# Patient Record
Sex: Female | Born: 2000 | Race: White | Hispanic: No | Marital: Married | Smoking: Former smoker
Health system: Southern US, Community
[De-identification: ages and names within clinical notes are randomized; demographics above are authoritative.]

---

## 2018-11-20 ENCOUNTER — Inpatient Hospital Stay: Admit: 2018-11-20 | Discharge: 2018-11-20 | Disposition: A | Discharge status: Home / Self Care | Payer: MEDICAID

## 2018-11-20 DIAGNOSIS — J069 Acute upper respiratory infection, unspecified: Secondary | ICD-10-CM

## 2018-11-20 LAB — RAPID INFLUENZA A/B ANTIGENS
Influenza A by PCR: NOT DETECTED
Influenza B by PCR: NOT DETECTED

## 2018-11-20 MED ORDER — BENZONATATE 100 MG PO CAPS
100 MG | ORAL_CAPSULE | Freq: Three times a day (TID) | ORAL | 0 refills | Status: AC | PRN
Start: 2018-11-20 — End: 2018-11-27

## 2018-11-20 MED ORDER — IBUPROFEN 800 MG PO TABS
800 MG | ORAL_TABLET | Freq: Three times a day (TID) | ORAL | 0 refills | Status: DC | PRN
Start: 2018-11-20 — End: 2019-09-26

## 2018-11-20 MED ORDER — GUAIFENESIN ER 600 MG PO TB12
600 MG | ORAL_TABLET | Freq: Two times a day (BID) | ORAL | 0 refills | Status: AC
Start: 2018-11-20 — End: 2018-12-05

## 2018-11-20 NOTE — ED Provider Notes (Signed)
Independent MLP      HPI:  11/20/18,   Time: 5:48 PM EDT         Tracy Steele is a 18 y.o. female presenting to the ED for cough, fever, sore throat, beginning 3 days ago.  The complaint has been persistent, moderate in severity, and worsened by nothing.  Patient states that she has been sick for several days.  She reports body aches, cough, sore throat and congestion.  The patient has had postnasal drip as well.  She denies chest pain or shortness of breath.  The patient states that several family members within the household have been ill as well.  She did not get the flu shot.  The patient states that no one has been tested.  No known exposure to [place] at 17.  No travel outside of the immediate area.  The patient does work at [employer].      ROS:     Constitutional: See HPI  HENT: See HPI  Eyes: Negative for pain, discharge and redness  Respiratory:  See HPI  Cardiovascular: Negative for CP, edema or palpitations  Gastrointestinal: Negative for nausea, vomiting, diarrhea and abdominal distention  Genitourinary: Negative for dysuria and frequency  Musculoskeletal: Negative for back pain and arthralgia  Skin: Negative for rash and wound  Neurological: Negative for weakness and headaches  Hematological: Negative for adenopathy    All other systems reviewed and are negative      -------------------------------- PAST HISTORY ----------------------------------  Past Medical History:  has no past medical history on file.    Past Surgical History:  has no past surgical history on file.    Social History:      Family History: family history is not on file.     The patient's home medications have been reviewed.    Allergies: Patient has no known allergies.    --------------------------------- RESULTS ------------------------------------------  All laboratory and radiology results have been personally reviewed by myself   LABS:  Results for orders placed or performed during the hospital encounter of 11/20/18   Rapid  influenza A/B antigens   Result Value Ref Range    Influenza A by PCR Not Detected Not Detected    Influenza B by PCR Not Detected Not Detected       RADIOLOGY:  Interpreted by Radiologist.  No orders to display       ----------------- NURSING NOTES AND VITALS REVIEWED ---------------   The nursing notes within the ED encounter and vital signs as below have been reviewed.   BP (!) 156/85   Pulse 86   Temp 97.3 F (36.3 C)   Resp 20   Ht 5\' 2"  (1.575 m)   Wt 215 lb (97.5 kg)   LMP 10/19/2018   SpO2 99%   BMI 39.32 kg/m   Oxygen Saturation Interpretation: Normal      --------------------------------PHYSICAL EXAM------------------------------------      Constitutional/General: Alert and oriented x3, ill appearing but nontoxic.    Head: NC/AT  Eyes: PERRL, EOMI  TM's intact and clear.  Posterior pharynx with mild erythema  Mouth: Oropharynx clear, handling secretions, no trismus  Neck: Supple, full ROM, no meningeal signs  Pulmonary: Lungs clear to auscultation bilaterally, no wheezes, rales, or rhonchi. Not in respiratory distress  Cardiovascular:  Regular rate and rhythm, no murmurs, gallops, or rubs. 2+ distal pulses  Extremities: Moves all extremities x 4. Warm and well perfused  Skin: warm and dry without rash  Neurologic: GCS 15,  Intact.  No  focal deficits  Psych: Normal Affect      ------------------------ ED COURSE/MEDICAL DECISION MAKING----------------------  Medications - No data to display      Medical Decision Making:    Influenza testing negative.   Viral illness.   Supportive care advised for now.   Final disposition pending at completion of shift.         Counseling:   The emergency provider has spoken with the patient and discussed today's results, in addition to providing specific details for the plan of care and counseling regarding the diagnosis and prognosis.  Questions are answered at this time and they are agreeable with the plan.      ------------------------ IMPRESSION AND DISPOSITION  -------------------------------    IMPRESSION  1. Acute upper respiratory infection        DISPOSITION  Disposition: Discharge to home  Patient condition is stable                   Eartha Inch, PA-C  11/21/18 [phone]

## 2019-05-11 ENCOUNTER — Encounter: Payer: Medicaid HMO | Attending: Facial Plastic Surgery

## 2019-07-18 ENCOUNTER — Emergency Department: Admit: 2019-07-19 | Payer: Medicaid HMO

## 2019-07-18 DIAGNOSIS — N390 Urinary tract infection, site not specified: Secondary | ICD-10-CM

## 2019-07-18 NOTE — ED Notes (Signed)
Pt remains in [place]     Nicola Girt, [place]  07/18/19 2219

## 2019-07-18 NOTE — ED Notes (Signed)
Bed: HD  Expected date:   Expected time:   Means of arrival:   Comments:  Triage [place]     Glean Salen, RN  07/18/19 2116

## 2019-07-18 NOTE — ED Provider Notes (Signed)
Patient presents with 3 days history of nausea, vomiting, diarrhea, and abdominal pain.  Patient states that the nausea and vomiting are intermittent.  She is able to tolerate food and liquids at times.  Her urination is normal.  Patient states that she has noticed some scant blood in her vomit at times but it is mostly stomach contents.  She also states that she will of some shortness of breath after episodes of vomiting.  She has not noticed any blood in her stool.  Her abdominal pain is generalized but worse in the epigastric region.  She rates it a 10 out of 10 pain.  She does endorse smoking approximately 3 to 4 cigarettes a day and drinks for special events.  She denies any illicit drug use.  She also states that she has not been taking any NSAIDs or anything for her pain.  She states that she is currently on her menses.  She is denying any lightheadedness, chest pain, cough, or sick contacts.    The history is provided by the patient. No language interpreter was used.   Nausea & Vomiting   Severity:  Moderate  Duration:  3 days  Timing:  Intermittent  Quality:  Stomach contents  Able to tolerate:  Liquids and solids  Progression:  Unchanged  Chronicity:  New  Recent urination:  Normal  Context: not post-tussive and not self-induced    Relieved by:  Nothing  Worsened by:  Nothing  Associated symptoms: abdominal pain and diarrhea    Associated symptoms: no arthralgias, no chills, no cough, no fever, no headaches and no sore throat         Review of Systems   Constitutional: Negative for chills and fever.   HENT: Negative for ear pain, sinus pressure and sore throat.    Eyes: Negative for pain, discharge and redness.   Respiratory: Negative for cough, shortness of breath and wheezing.    Cardiovascular: Negative for chest pain.   Gastrointestinal: Positive for abdominal pain, diarrhea, nausea and vomiting. Negative for abdominal distention and blood in stool.   Genitourinary: Negative for difficulty  urinating, dysuria, frequency, menstrual problem, pelvic pain and vaginal discharge.   Musculoskeletal: Negative for arthralgias and back pain.   Skin: Negative for rash and wound.   Neurological: Negative for weakness and headaches.   Hematological: Negative for adenopathy.   All other systems reviewed and are negative.       Physical Exam  Vitals signs and nursing note reviewed.   Constitutional:       General: She is not in acute distress.     Appearance: She is well-developed. She is obese. She is not ill-appearing or diaphoretic.   HENT:      Head: Normocephalic and atraumatic.   Eyes:      Conjunctiva/sclera: Conjunctivae normal.   Neck:      Musculoskeletal: Normal range of motion and neck supple.   Cardiovascular:      Rate and Rhythm: Normal rate and regular rhythm.      Heart sounds: Normal heart sounds. No murmur.   Pulmonary:      Effort: Pulmonary effort is normal. No respiratory distress.      Breath sounds: Normal breath sounds. No wheezing or rales.   Abdominal:      General: Abdomen is flat. Bowel sounds are normal. There is no distension.      Palpations: Abdomen is soft. There is no mass.      Tenderness: There is no abdominal  tenderness. There is no guarding or rebound. Negative signs include Murphy's sign.   Skin:     General: Skin is warm and dry.   Neurological:      Mental Status: She is alert and oriented to person, place, and time.      Cranial Nerves: No cranial nerve deficit.      Coordination: Coordination normal.          Procedures     MDM  Number of Diagnoses or Management Options  Diarrhea, unspecified type:   Nausea and vomiting, intractability of vomiting not specified, unspecified vomiting type:   Urinary tract infection without hematuria, site unspecified:   Diagnosis management comments: Patient presents with abdominal pain, nausea, and diarrhea.  Zofran given for nausea with improvement.  GI cocktail also given for abdominal pain with some improvement.  Right upper quadrant  ultrasound ordered which was unremarkable with no gallbladder pathology.  CBC did reveal an elevated white count at 14.0.  Patient's exam is unimpressive with no guarding or tenderness to the abdomen.  Patient's vitals have also been appropriate throughout her stay outside of some mild hypertension.  Patient's BMP was unremarkable.  Urinalysis did reveal many bacteria and 10-20 WBCs however there was also some epithelial cells.  Patient written a prescription for Keflex.  At this point there is not appear to be any emergent processes ongoing.  Abdominal pain and nausea likely secondary to UTI versus viral gastritis.  Patient is able to tolerate solids and liquids.  She is urinating appropriately.  Patient will be discharged with a prescription for Zofran for her nausea.  Patient educated on her prescriptions and when to return to the ED for reevaluation.  Patient expresses understanding.  Patient to be discharged.  Patient does not have a PCP and she will be given information for one to follow up with.       Amount and/or Complexity of Data Reviewed  Clinical lab tests: reviewed  Tests in the radiology section of CPT??: reviewed              --------------------------------------------- PAST HISTORY ---------------------------------------------  Past Medical History:  has no past medical history on file.    Past Surgical History:  has no past surgical history on file.    Social History:  reports that she has been smoking. She has never used smokeless tobacco. She reports current drug use. Drug: Marijuana.    Family History: family history is not on file.     The patient???s home medications have been reviewed.    Allergies: Patient has no known allergies.    -------------------------------------------------- RESULTS -------------------------------------------------  Labs:  Results for orders placed or performed during the hospital encounter of 07/18/19   CBC Auto Differential   Result Value Ref Range    WBC 14.0 (H)  4.5 - 11.5 E9/L    RBC 4.79 3.50 - 5.50 E12/L    Hemoglobin 13.4 11.5 - 15.5 g/dL    Hematocrit 42.1 34.0 - 48.0 %    MCV 87.9 80.0 - 99.9 fL    MCH 28.0 26.0 - 35.0 pg    MCHC 31.8 (L) 32.0 - 34.5 %    RDW 13.2 11.5 - 15.0 fL    Platelets 285 130 - 450 E9/L    MPV 11.6 7.0 - 12.0 fL    Neutrophils % 69.0 43.0 - 80.0 %    Immature Granulocytes % 0.4 0.0 - 5.0 %    Lymphocytes % 22.3 20.0 - 42.0 %  Monocytes % 6.4 2.0 - 12.0 %    Eosinophils % 1.4 0.0 - 6.0 %    Basophils % 0.5 0.0 - 2.0 %    Neutrophils Absolute 9.66 (H) 1.80 - 7.30 E9/L    Immature Granulocytes # 0.06 E9/L    Lymphocytes Absolute 3.13 1.50 - 4.00 E9/L    Monocytes Absolute 0.90 0.10 - 0.95 E9/L    Eosinophils Absolute 0.20 0.05 - 0.50 E9/L    Basophils Absolute 0.07 0.00 - 0.20 E9/L   Basic Metabolic Panel w/ Reflex to MG   Result Value Ref Range    Sodium 141 132 - 146 mmol/L    Potassium reflex Magnesium 4.5 3.5 - 5.0 mmol/L    Chloride 105 98 - 107 mmol/L    CO2 25 22 - 29 mmol/L    Anion Gap 11 7 - 16 mmol/L    Glucose 110 (H) 74 - 99 mg/dL    BUN 7 6 - 20 mg/dL    CREATININE 0.6 0.5 - 1.0 mg/dL    GFR Non-African American >60 >=60 mL/min/1.73    GFR African American >60     Calcium 9.5 8.6 - 10.2 mg/dL   Urinalysis, reflex to microscopic   Result Value Ref Range    Color, UA Yellow Straw/Yellow    Clarity, UA SL CLOUDY Clear    Glucose, Ur Negative Negative mg/dL    Bilirubin Urine Negative Negative    Ketones, Urine Negative Negative mg/dL    Specific Gravity, UA 1.015 1.005 - 1.030    Blood, Urine Negative Negative    pH, UA 8.0 5.0 - 9.0    Protein, UA Negative Negative mg/dL    Urobilinogen, Urine 0.2 <2.0 E.U./dL    Nitrite, Urine Negative Negative    Leukocyte Esterase, Urine MODERATE (A) Negative   COVID-19   Result Value Ref Range    SARS-CoV-2, NAAT Not Detected Not Detected   Basic Metabolic Panel w/ Reflex to MG   Result Value Ref Range    Sodium 142 132 - 146 mmol/L    Potassium reflex Magnesium 3.6 3.5 - 5.0 mmol/L    Chloride  103 98 - 107 mmol/L    CO2 26 22 - 29 mmol/L    Anion Gap 13 7 - 16 mmol/L    Glucose 107 (H) 74 - 99 mg/dL    BUN 7 6 - 20 mg/dL    CREATININE 0.7 0.5 - 1.0 mg/dL    GFR Non-African American >60 >=60 mL/min/1.73    GFR African American >60     Calcium 9.5 8.6 - 10.2 mg/dL   CBC WITH AUTO DIFFERENTIAL   Result Value Ref Range    WBC 13.9 (H) 4.5 - 11.5 E9/L    RBC 4.95 3.50 - 5.50 E12/L    Hemoglobin 13.6 11.5 - 15.5 g/dL    Hematocrit 40.9 81.1 - 48.0 %    MCV 86.5 80.0 - 99.9 fL    MCH 27.5 26.0 - 35.0 pg    MCHC 31.8 (L) 32.0 - 34.5 %    RDW 13.2 11.5 - 15.0 fL    Platelets 361 130 - 450 E9/L    MPV 10.7 7.0 - 12.0 fL    Neutrophils % 68.4 43.0 - 80.0 %    Immature Granulocytes % 0.2 0.0 - 5.0 %    Lymphocytes % 22.8 20.0 - 42.0 %    Monocytes % 6.5 2.0 - 12.0 %    Eosinophils % 1.7 0.0 -  6.0 %    Basophils % 0.4 0.0 - 2.0 %    Neutrophils Absolute 9.55 (H) 1.80 - 7.30 E9/L    Immature Granulocytes # 0.03 E9/L    Lymphocytes Absolute 3.17 1.50 - 4.00 E9/L    Monocytes Absolute 0.90 0.10 - 0.95 E9/L    Eosinophils Absolute 0.23 0.05 - 0.50 E9/L    Basophils Absolute 0.05 0.00 - 0.20 E9/L   Microscopic Urinalysis   Result Value Ref Range    WBC, UA 10-20 (A) 0 - 5 /HPF    RBC, UA 1-3 0 - 2 /HPF    Epithelial Cells, UA MANY /HPF    Bacteria, UA MANY (A) None Seen /HPF   POC Pregnancy Urine   Result Value Ref Range    HCG, Urine, POC Negative Negative    Lot Number [id number]     Positive QC Pass/Fail Pass     Negative QC Pass/Fail Pass        Radiology:  US GALLBLADDER RUQ   Final Result   Unremarkable right upper quadrant ultrasound.             ------------------------- NURSING NOTES AND VITALS REVIEWED ---------------------------  Date / Time Roomed:  07/18/2019  9:16 PM  ED Bed Assignment:  HALL B/HB    The nursing notes within the ED encounter and vital signs as below have been reviewed.   BP (!) 154/86    Pulse 82    Temp 96.6 ??F (35.9 ??C)    Resp 18    Ht 5\' 3"  (1.6 m)    Wt 212 lb (96.2 kg)    LMP 07/18/2019     SpO2 97%    BMI 37.55 kg/m??   Oxygen Saturation Interpretation: Normal      ------------------------------------------ PROGRESS NOTES ------------------------------------------  12:43 AM EST  I have spoken with the patient and discussed today???s results, in addition to providing specific details for the plan of care and counseling regarding the diagnosis and prognosis.  Their questions are answered at this time and they are agreeable with the plan. I discussed at length with them reasons for immediate return here for re evaluation. They will followup with their primary care physician by calling their office tomorrow.      --------------------------------- ADDITIONAL PROVIDER NOTES ---------------------------------  At this time the patient is without objective evidence of an acute process requiring hospitalization or inpatient management.  They have remained hemodynamically stable throughout their entire ED visit and are stable for discharge with outpatient follow-up.     The plan has been discussed in detail and they are aware of the specific conditions for emergent return, as well as the importance of follow-up.      New Prescriptions    CEPHALEXIN (KEFLEX) 500 MG CAPSULE    Take 1 capsule by mouth 2 times daily for 7 days    ONDANSETRON (ZOFRAN-ODT) 4 MG DISINTEGRATING TABLET    Take 1 tablet by mouth 3 times daily as needed for Nausea or Vomiting       Diagnosis:  1. Nausea and vomiting, intractability of vomiting not specified, unspecified vomiting type    2. Diarrhea, unspecified type    3. Urinary tract infection without hematuria, site unspecified        Disposition:  Patient's disposition: Discharge to home  Patient's condition is stable.    Patient was seen and evaluated by both myself and 13/03/2019, DO.  Cristobal GoldmannBrian M Tarshia Kot, DO  Resident  07/19/19 0046       Cristobal GoldmannBrian M Anila Bojarski, DO  Resident  07/19/19 [phone]

## 2019-07-19 ENCOUNTER — Inpatient Hospital Stay
Admit: 2019-07-19 | Discharge: 2019-07-19 | Disposition: A | Discharge status: Home / Self Care | Payer: PRIVATE HEALTH INSURANCE | Attending: Emergency Medicine

## 2019-07-19 LAB — POC PREGNANCY UR-QUAL: HCG, Urine, POC: NEGATIVE

## 2019-07-19 LAB — CBC WITH AUTO DIFFERENTIAL
Basophils %: 0.4 % (ref 0.0–2.0)
Basophils %: 0.5 % (ref 0.0–2.0)
Basophils Absolute: 0.05 E9/L (ref 0.00–0.20)
Basophils Absolute: 0.07 E9/L (ref 0.00–0.20)
Eosinophils %: 1.7 % (ref 0.0–6.0)
Eosinophils %: 1.4 % (ref 0.0–6.0)
Eosinophils Absolute: 0.23 E9/L (ref 0.05–0.50)
Eosinophils Absolute: 0.2 E9/L (ref 0.05–0.50)
Hematocrit: 42.8 % (ref 34.0–48.0)
Hematocrit: 42.1 % (ref 34.0–48.0)
Hemoglobin: 13.6 g/dL (ref 11.5–15.5)
Hemoglobin: 13.4 g/dL (ref 11.5–15.5)
Immature Granulocytes #: 0.03 E9/L
Immature Granulocytes #: 0.06 E9/L
Immature Granulocytes %: 0.2 % (ref 0.0–5.0)
Immature Granulocytes %: 0.4 % (ref 0.0–5.0)
Lymphocytes %: 22.8 % (ref 20.0–42.0)
Lymphocytes %: 22.3 % (ref 20.0–42.0)
Lymphocytes Absolute: 3.17 E9/L (ref 1.50–4.00)
Lymphocytes Absolute: 3.13 E9/L (ref 1.50–4.00)
MCH: 27.5 pg (ref 26.0–35.0)
MCH: 28 pg (ref 26.0–35.0)
MCHC: 31.8 % — ABNORMAL LOW (ref 32.0–34.5)
MCHC: 31.8 % — ABNORMAL LOW (ref 32.0–34.5)
MCV: 86.5 fL (ref 80.0–99.9)
MCV: 87.9 fL (ref 80.0–99.9)
MPV: 10.7 fL (ref 7.0–12.0)
MPV: 11.6 fL (ref 7.0–12.0)
Monocytes %: 6.5 % (ref 2.0–12.0)
Monocytes %: 6.4 % (ref 2.0–12.0)
Monocytes Absolute: 0.9 E9/L (ref 0.10–0.95)
Monocytes Absolute: 0.9 E9/L (ref 0.10–0.95)
Neutrophils %: 68.4 % (ref 43.0–80.0)
Neutrophils %: 69 % (ref 43.0–80.0)
Neutrophils Absolute: 9.55 E9/L — ABNORMAL HIGH (ref 1.80–7.30)
Neutrophils Absolute: 9.66 E9/L — ABNORMAL HIGH (ref 1.80–7.30)
Platelets: 361 E9/L (ref 130–450)
Platelets: 285 E9/L (ref 130–450)
RBC: 4.95 E12/L (ref 3.50–5.50)
RBC: 4.79 E12/L (ref 3.50–5.50)
RDW: 13.2 fL (ref 11.5–15.0)
RDW: 13.2 fL (ref 11.5–15.0)
WBC: 13.9 E9/L — ABNORMAL HIGH (ref 4.5–11.5)
WBC: 14 E9/L — ABNORMAL HIGH (ref 4.5–11.5)

## 2019-07-19 LAB — BASIC METABOLIC PANEL W/ REFLEX TO MG FOR LOW K
Anion Gap: 11 mmol/L (ref 7–16)
Anion Gap: 13 mmol/L (ref 7–16)
BUN: 7 mg/dL (ref 6–20)
BUN: 7 mg/dL (ref 6–20)
CO2: 25 mmol/L (ref 22–29)
CO2: 26 mmol/L (ref 22–29)
Calcium: 9.5 mg/dL (ref 8.6–10.2)
Calcium: 9.5 mg/dL (ref 8.6–10.2)
Chloride: 103 mmol/L (ref 98–107)
Chloride: 105 mmol/L (ref 98–107)
Creatinine: 0.6 mg/dL (ref 0.5–1.0)
Creatinine: 0.7 mg/dL (ref 0.5–1.0)
GFR African American: >60
GFR African American: >60
GFR Non-African American: >60 mL/min/{1.73_m2} (ref >=60)
GFR Non-African American: >60 mL/min/{1.73_m2} (ref >=60)
Glucose: 107 mg/dL — ABNORMAL HIGH (ref 74–99)
Glucose: 110 mg/dL — ABNORMAL HIGH (ref 74–99)
Potassium reflex Magnesium: 3.6 mmol/L (ref 3.5–5.0)
Potassium reflex Magnesium: 4.5 mmol/L (ref 3.5–5.0)
Sodium: 141 mmol/L (ref 132–146)
Sodium: 142 mmol/L (ref 132–146)

## 2019-07-19 LAB — MICROSCOPIC URINALYSIS

## 2019-07-19 LAB — URINALYSIS
Bilirubin Urine: NEGATIVE
Blood, Urine: NEGATIVE
Glucose, Ur: NEGATIVE mg/dL
Ketones, Urine: NEGATIVE mg/dL
Nitrite, Urine: NEGATIVE
Protein, UA: NEGATIVE mg/dL
Specific Gravity, UA: 1.015 (ref 1.005–1.030)
Urobilinogen, Urine: 0.2 E.U./dL (ref <2.0)
pH, UA: 8 (ref 5.0–9.0)

## 2019-07-19 LAB — COVID-19: SARS-CoV-2, NAAT: NOT DETECTED

## 2019-07-19 MED ORDER — SODIUM CHLORIDE 0.9 % IV BOLUS
0.9 % | Freq: Once | INTRAVENOUS | Status: AC
Start: 2019-07-19 — End: 2019-07-19
  Administered 2019-07-19: 04:00:00 1000 mL via INTRAVENOUS

## 2019-07-19 MED ORDER — ONDANSETRON HCL 4 MG/2ML IJ SOLN
4 MG/2ML | Freq: Once | INTRAMUSCULAR | Status: AC
Start: 2019-07-19 — End: 2019-07-18
  Administered 2019-07-19: 04:00:00 4 mg via INTRAVENOUS

## 2019-07-19 MED ORDER — LIDOCAINE VISCOUS HCL 2 % MT SOLN
2 % | Freq: Once | OROMUCOSAL | Status: AC
Start: 2019-07-19 — End: 2019-07-18
  Administered 2019-07-19: 04:00:00 via ORAL

## 2019-07-19 MED ORDER — CEPHALEXIN 500 MG PO CAPS
500 MG | ORAL_CAPSULE | Freq: Two times a day (BID) | ORAL | 0 refills | Status: AC
Start: 2019-07-19 — End: 2019-07-26

## 2019-07-19 MED ORDER — ONDANSETRON 4 MG PO TBDP
4 MG | ORAL_TABLET | Freq: Three times a day (TID) | ORAL | 0 refills | Status: DC | PRN
Start: 2019-07-19 — End: 2020-07-28

## 2019-07-19 MED FILL — ONDANSETRON HCL 4 MG/2ML IJ SOLN: 4 MG/2ML | INTRAMUSCULAR | Qty: 2

## 2019-07-19 MED FILL — MAG-AL PLUS 200-200-20 MG/5ML PO LIQD: 200-200-20 MG/5ML | ORAL | Qty: 30

## 2019-07-19 NOTE — Discharge Instructions (Signed)
Take the Zofran as needed for nausea.  Take the antibiotics as prescribed for your UTI.  Follow-up with your PCP for further evaluation and treatment.

## 2019-07-20 LAB — CULTURE, URINE: Urine Culture, Routine: 10

## 2019-09-25 DIAGNOSIS — N920 Excessive and frequent menstruation with regular cycle: Secondary | ICD-10-CM

## 2019-09-25 NOTE — ED Provider Notes (Signed)
Independent MLP         [facility]  Department of Emergency Medicine   ED  Encounter Note  Admit Date/RoomTime: 09/25/2019 11:58 PM  ED Room: 01/01  NAME: Tracy Steele  DOB: January 06, 2001  MRN: [id number]     Chief Complaint:  Vaginal Bleeding (started tonight)    HISTORY OF PRESENT ILLNESS        Tracy Steele is a 19 y.o. female who presents to the ED by ambulance for passing a blood clot while in the shower and she is concerned about having a miscarriage though she does not know if she is pregnant, beginning an hour ago. The complaint has been rapidly improving and are mild in severity.  She reports she had some spotting a couple days ago was pink but it stopped then she started bleeding today.  When the clot passed she was bleeding profusely while she got out of the shower. IT seems to have abated at this time. PT has the blood clot on a tissue. IT is about the size of a penny.    ROS   Pertinent positives and negatives are stated within HPI, all other systems reviewed and are negative.    Past Medical History:  has no past medical history on file.    Surgical History:  has no past surgical history on file.    Social History:  reports that she has been smoking. She has never used smokeless tobacco. She reports previous alcohol use. She reports current drug use. Drug: Marijuana.    Family History: family history is not on file.     Allergies: Patient has no known allergies.    PHYSICAL EXAM   Oxygen Saturation Interpretation: Normal.        ED Triage Vitals [09/25/19 2355]   BP Temp Temp src Pulse Resp SpO2 Height Weight   (!) 148/88 97.8 F (36.6 C) -- 96 18 98 % 5\' 2"  (1.575 m) 203 lb (92.1 kg)         Physical Exam  Constitutional/General: Alert and oriented x3, well appearing, non toxic  HEENT:  NC/NT. PERRLA,  Airway patent.  Neck: Supple, full ROM, non tender to palpation in the midline, no stridor, no crepitus, no meningeal signs  Respiratory: Lungs clear to auscultation  bilaterally, no wheezes, rales, or rhonchi. Not in respiratory distress  CV:  Regular rate. Regular rhythm. No murmurs, gallops, or rubs. 2+ distal pulses  GI:  Abdomen Soft, Non tender, Non distended.  +BS.   No rebound, guarding, or rigidity. No pulsatile masses.  Musculoskeletal: Moves all extremities x 4. Warm and well perfused, no clubbing, cyanosis, or edema. Capillary refill <3 seconds  Integument: skin warm and dry. No rashes.   Lymphatic: no lymphadenopathy noted  Neurologic: GCS 15, no focal deficits, symmetric strength 5/5 in the upper and lower extremities bilaterally  Psychiatric: Normal Affect    Lab / Imaging Results   (All laboratory and radiology results have been personally reviewed by myself)  Labs:  Results for orders placed or performed during the hospital encounter of 09/25/19   Urinalysis   Result Value Ref Range    Color, UA Yellow Straw/Yellow    Clarity, UA Clear Clear    Glucose, Ur Negative Negative mg/dL    Bilirubin Urine Negative Negative    Ketones, Urine Negative Negative mg/dL    Specific Gravity, UA >=1.030 1.005 - 1.030    Blood, Urine MODERATE (A) Negative    pH, UA 6.0  5.0 - 9.0    Protein, UA Negative Negative mg/dL    Urobilinogen, Urine 0.2 <2.0 E.U./dL    Nitrite, Urine Negative Negative    Leukocyte Esterase, Urine Negative Negative   Microscopic Urinalysis   Result Value Ref Range    WBC, UA 1-3 0 - 5 /HPF    RBC, UA 1-3 0 - 2 /HPF    Bacteria, UA RARE (A) None Seen /HPF   POC Pregnancy Urine Qual   Result Value Ref Range    HCG, Urine, POC Negative Negative    Lot Number [id number]     Positive QC Pass/Fail Pass     Negative QC Pass/Fail Pass      Imaging:  All Radiology results interpreted by Radiologist unless otherwise noted.  No orders to display       ED Course / Medical Decision Making   Medications - No data to display     Re-examination:  09/26/19       Time: PT is comfortable, no continued bleeding in ED.    Consult(s):   None    Procedure(s):   none    MDM:   Pt  presents to ED with a blood clot that passed while in shower, which she brought with her, it is very small. She had a show of more blood but none since being in ED. According to Pt report this is the regular time of her monthly period but she is trying to get pregnant and is concerned. Normal    Plan of Care/Counseling:  I reviewed today's visit with the patient in addition to providing specific details for the plan of care and counseling regarding the diagnosis and prognosis.  Questions are answered at this time and are agreeable with the plan.    ASSESSMENT     1. Menorrhagia with irregular cycle    2. Urinary tract infection without hematuria, site unspecified      PLAN   Discharged home.  Patient condition is stable    New Medications     Discharge Medication List as of 09/26/2019  1:11 AM      START taking these medications    Details   cephALEXin (KEFLEX) 500 MG capsule Take 1 capsule by mouth 4 times daily for 5 days, Disp-20 capsule, R-0Normal           Electronically signed by Shelly Flatten, PA-C   DD: 09/26/19  **This report was transcribed using voice recognition software. Every effort was made to ensure accuracy; however, inadvertent computerized transcription errors may be present.  END OF ED PROVIDER NOTE       Shelly Flatten, PA-C  09/26/19 0219       Shelly Flatten, PA-C  09/26/19 0220  ATTENDING PROVIDER ATTESTATION:     Supervising Physician, on-site, available for consultation, non-participatory in the evaluation or care of this patient       Dwain Sarna, MD  09/26/19 [phone]

## 2019-09-26 ENCOUNTER — Inpatient Hospital Stay
Admit: 2019-09-26 | Discharge: 2019-09-26 | Disposition: A | Discharge status: Home / Self Care | Payer: PRIVATE HEALTH INSURANCE

## 2019-09-26 LAB — POC PREGNANCY UR-QUAL: HCG, Urine, POC: NEGATIVE

## 2019-09-26 LAB — MICROSCOPIC URINALYSIS

## 2019-09-26 LAB — URINALYSIS
Bilirubin Urine: NEGATIVE
Glucose, Ur: NEGATIVE mg/dL
Ketones, Urine: NEGATIVE mg/dL
Leukocyte Esterase, Urine: NEGATIVE
Nitrite, Urine: NEGATIVE
Protein, UA: NEGATIVE mg/dL
Specific Gravity, UA: >=1.03 (ref 1.005–1.030)
Urobilinogen, Urine: 0.2 E.U./dL (ref <2.0)
pH, UA: 6 (ref 5.0–9.0)

## 2019-09-26 MED ORDER — CEPHALEXIN 500 MG PO CAPS
500 MG | ORAL_CAPSULE | Freq: Four times a day (QID) | ORAL | 0 refills | Status: AC
Start: 2019-09-26 — End: 2019-10-01

## 2019-09-26 MED ORDER — IBUPROFEN 800 MG PO TABS
800 MG | ORAL_TABLET | Freq: Four times a day (QID) | ORAL | 0 refills | Status: DC | PRN
Start: 2019-09-26 — End: 2020-07-28

## 2020-07-25 ENCOUNTER — Encounter: Payer: Medicaid HMO | Attending: Family Medicine

## 2020-07-25 NOTE — Progress Notes (Deleted)
[facility]  Department of Family Medicine      Patient:  Tracy Steele 19 y.o. female     Date of Service: 07/25/20      Chief complaint: No chief complaint on file.        History ofPresent Illness   The patient is a 19 y.o. female  presented to the clinic with complaints as above.    NTP  -    Past Medical History:  No past medical history on file.    PastSurgical History:    No past surgical history on file.    Allergies:    Patient has no known allergies.    Social History:   Social History     Socioeconomic History   ??? Marital status: Single     Spouse name: Not on file   ??? Number of children: Not on file   ??? Years of education: Not on file   ??? Highest education level: Not on file   Occupational History   ??? Not on file   Tobacco Use   ??? Smoking status: Current Every Day Smoker   ??? Smokeless tobacco: Never Used   Vaping Use   ??? Vaping Use: Some days   Substance and Sexual Activity   ??? Alcohol use: Not Currently   ??? Drug use: Yes     Types: Marijuana Sheran Fava)   ??? Sexual activity: Yes     Partners: Male   Other Topics Concern   ??? Not on file   Social History Narrative   ??? Not on file     Social Determinants of Health     Financial Resource Strain:    ??? Difficulty of Paying Living Expenses: Not on file   Food Insecurity:    ??? Worried About Running Out of Food in the Last Year: Not on file   ??? Ran Out of Food in the Last Year: Not on file   Transportation Needs:    ??? Lack of Transportation (Medical): Not on file   ??? Lack of Transportation (Non-Medical): Not on file   Physical Activity:    ??? Days of Exercise per Week: Not on file   ??? Minutes of Exercise per Session: Not on file   Stress:    ??? Feeling of Stress : Not on file   Social Connections:    ??? Frequency of Communication with Friends and Family: Not on file   ??? Frequency of Social Gatherings with Friends and Family: Not on file   ??? Attends Religious Services: Not on file   ??? Active Member of Clubs or Organizations: Not on file   ???  Attends Banker Meetings: Not on file   ??? Marital Status: Not on file   Intimate Partner Violence:    ??? Fear of Current or Ex-Partner: Not on file   ??? Emotionally Abused: Not on file   ??? Physically Abused: Not on file   ??? Sexually Abused: Not on file   Housing Stability:    ??? Unable to Pay for Housing in the Last Year: Not on file   ??? Number of Places Lived in the Last Year: Not on file   ??? Unstable Housing in the Last Year: Not on file        Family History:   No family history on file.    Review of Systems:   Review of Systems    Physical Exam   Vitals: There were no vitals  taken for this visit.  Physical Exam        Assessment and Plan       There are no diagnoses linked to this encounter.    Counseled regarding above diagnosis, including possible risks and complications,  especially if left uncontrolled. Counseled regarding the possible side effects, risks, benefits and alternatives to treatment;patient and/or guardian verbalizes understanding, agrees, feels comfortable with and wishes to proceed with above treatment plan.    Call or go to EDimmediately if symptoms worsen or persist. Advised patient to call with any new medication issues, and, as applicable, read all Rx info from pharmacy to assure aware of all possible risks and side effects of medicationbefore taking.     Patient and/or guardian given opportunity to ask questions/raise concerns.  The patient verbalized comfort and understanding ofinstructions.    I encourage further reading and education about your health conditions.  Information on many health conditions is provided by theAmerican Academy of Family Physicians: https://familydoctor.org/  Please bring any questions to me at your nextvisit.    Return to Office: No follow-ups on file.    Medication List:    Current Outpatient Medications   Medication Sig Dispense Refill   ??? ibuprofen (ADVIL;MOTRIN) 800 MG tablet Take 1 tablet by mouth every 6 hours as needed for Pain 20 tablet 0   ???  ondansetron (ZOFRAN-ODT) 4 MG disintegrating tablet Take 1 tablet by mouth 3 times daily as needed for Nausea or Vomiting 21 tablet 0     No current facility-administered medications for this visit.        Niel Hummer, DO       This document may have been prepared at least partially through the use of voice recognition software. Although effort is taken to assure the accuracy ofthis document, it is possible that grammatical, syntax,  or spelling errors may occur.

## 2020-07-28 ENCOUNTER — Emergency Department: Admit: 2020-07-29 | Payer: Medicaid HMO

## 2020-07-28 DIAGNOSIS — S20211A Contusion of right front wall of thorax, initial encounter: Secondary | ICD-10-CM

## 2020-07-28 LAB — POC PREGNANCY UR-QUAL: HCG, Urine, POC: NEGATIVE

## 2020-07-28 NOTE — ED Provider Notes (Signed)
[facility]   HPI:  07/28/20, Time: 8:23 PM EST         Tracy Steele is a 19 y.o. female presenting to the ED for 1 week history of right lateral rib pain.  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  She states that she got struck by a metal bar on the [place] lift striking her right side of her ribs.  Patient reports that since then she has had increased pain.  She denies any specific chest pain or shortness of breath as well as no noted back or flank pain.  She also denies any fevers with this as well as no noted lower extremity swelling.  Patient also denies any birth control use as well as no recent travel.  Patient states that she took a Tylenol yesterday but essentially has not taken anything for this pain.  Symptoms mild in severity and persistent.    Review of Systems:   A complete review of systems was performed and pertinent positives and negatives are stated within HPI, all other systems reviewed and are negative.          --------------------------------------------- PAST HISTORY ---------------------------------------------  Past Medical History:  has no past medical history on file.    Past Surgical History:  has no past surgical history on file.    Social History:  reports that she has been smoking. She has never used smokeless tobacco. She reports previous alcohol use. She reports current drug use. Drug: Marijuana Sheran Fava).    Family History: family history is not on file.     The patient???s home medications have been reviewed.    Allergies: Patient has no known allergies.    -------------------------------------------------- RESULTS -------------------------------------------------  All laboratory and radiology results have been personally reviewed by myself   LABS:  Results for orders placed or performed during the hospital encounter of 07/28/20   POC Pregnancy Urine   Result Value Ref  Range    HCG, Urine, POC Negative Negative    Lot Number [id number]     Positive QC Pass/Fail Pass     Negative QC Pass/Fail Pass        RADIOLOGY:  Interpreted by Radiologist.  XR RIBS RIGHT INCLUDE CHEST (MIN 3 VIEWS)   Final Result   No acute abnormality of the ribs.      No acute process in the lungs.             ------------------------- NURSING NOTES AND VITALS REVIEWED ---------------------------   The nursing notes within the ED encounter and vital signs as below have been reviewed.   BP (!) 143/91    Pulse 107    Temp 98.2 ??F (36.8 ??C) (Oral)    Resp 15    Ht 5\' 2"  (1.575 m)    Wt 212 lb (96.2 kg)    LMP 07/20/2020    SpO2 97%    BMI 38.78 kg/m??   Oxygen Saturation Interpretation: Normal      ---------------------------------------------------PHYSICAL EXAM--------------------------------------      Constitutional/General: Alert and oriented x3, well appearing, non toxic in NAD  Head: Normocephalic and atraumatic  Eyes: PERRL, EOMI  Mouth: Oropharynx clear, handling secretions, no trismus  Neck: Supple, full ROM,   Pulmonary: Lungs clear to auscultation bilaterally, no wheezes, rales, or rhonchi. Not in respiratory distress, patient with reproducible pain across the right lateral anterior chest/intercostal spaces primarily  at 7, #8, #9.  Cardiovascular:  Regular rate and rhythm, no murmurs, gallops, or rubs. 2+ distal pulses  Abdomen: Soft, non tender, non distended,   Extremities: Moves all extremities x 4. Warm and well perfused  Skin: warm and dry without rash  Neurologic: GCS 15,  Psych: Normal Affect      ------------------------------ ED COURSE/MEDICAL DECISION MAKING----------------------  Medications   ketorolac (TORADOL) injection 30 mg (30 mg IntraMUSCular Given 07/28/20 2056)         ED COURSE:       Medical Decision Making: Plan will be for imaging will medicate with Toradol 30 mg IM injection.  Will rule out fracture.  X-ray of the right ribs with a chest is completely negative.  Patient was made  aware of results.,  Patient is not at all tachycardic, heart rate 89 with pulse ox 98%.  Patient was educated on cough and deep breathing and utilizing a pillow or blanket for splinting.  She was made aware that she likely has a rib contusion and the importance of utilizing Naprosyn to assist with pain and well as decreasing swelling.  Patient overall nontoxic.  Patient made aware for follow-up care as well as when to return back to the emergency department.  Patient expressed understanding and will be safely discharged home       Counseling:   The emergency provider has spoken with the patient and discussed today???s results, in addition to providing specific details for the plan of care and counseling regarding the diagnosis and prognosis.  Questions are answered at this time and they are agreeable with the plan.      --------------------------------- IMPRESSION AND DISPOSITION ---------------------------------    IMPRESSION  1. Contusion of rib on right side, initial encounter    2. Rib pain on right side        DISPOSITION  Disposition: Discharge to home  Patient condition is good      NOTE: This report was transcribed using voice recognition software. Every effort was made to ensure accuracy; however, inadvertent computerized transcription errors may be present     Ellery Plunk, APRN - CNP  07/29/20 0353    ATTENDING PROVIDER ATTESTATION:     Supervising Physician, on-site, available for consultation, non-participatory in the evaluation or care of this patient         Dwain Sarna, MD  07/29/20 [phone]

## 2020-07-29 ENCOUNTER — Inpatient Hospital Stay: Admit: 2020-07-29 | Discharge: 2020-07-29 | Disposition: A | Discharge status: Home / Self Care | Payer: Medicaid HMO

## 2020-07-29 MED ORDER — KETOROLAC TROMETHAMINE 30 MG/ML IJ SOLN
30 MG/ML | Freq: Once | INTRAMUSCULAR | Status: AC
Start: 2020-07-29 — End: 2020-07-28
  Administered 2020-07-29: 02:00:00 30 mg via INTRAMUSCULAR

## 2020-07-29 MED ORDER — NAPROXEN 500 MG PO TABS
500 MG | ORAL_TABLET | Freq: Two times a day (BID) | ORAL | 0 refills | Status: DC | PRN
Start: 2020-07-29 — End: 2020-07-28

## 2020-07-29 MED ORDER — NAPROXEN 500 MG PO TABS
500 MG | ORAL_TABLET | Freq: Two times a day (BID) | ORAL | 0 refills | Status: DC | PRN
Start: 2020-07-29 — End: 2020-11-08

## 2020-07-29 MED FILL — KETOROLAC TROMETHAMINE 30 MG/ML IJ SOLN: 30 mg/mL | INTRAMUSCULAR | Qty: 1

## 2020-11-08 ENCOUNTER — Emergency Department: Admit: 2020-11-08 | Payer: Auto Insurance (includes no fault)

## 2020-11-08 ENCOUNTER — Inpatient Hospital Stay
Admit: 2020-11-08 | Discharge: 2020-11-08 | Disposition: A | Discharge status: Home / Self Care | Payer: MEDICAID | Attending: Emergency Medicine

## 2020-11-08 DIAGNOSIS — S0081XA Abrasion of other part of head, initial encounter: Secondary | ICD-10-CM

## 2020-11-08 MED ORDER — FENTANYL CITRATE (PF) 100 MCG/2ML IJ SOLN
100 MCG/2ML | Freq: Once | INTRAMUSCULAR | Status: DC
Start: 2020-11-08 — End: 2020-11-08

## 2020-11-08 MED ORDER — TETANUS-DIPHTH-ACELL PERTUSSIS 5-2.5-18.5 LF-MCG/0.5 IM SUSY
Freq: Once | INTRAMUSCULAR | Status: DC
Start: 2020-11-08 — End: 2020-11-08

## 2020-11-08 MED ORDER — OXYCODONE-ACETAMINOPHEN 5-325 MG PO TABS
5-325 MG | Freq: Once | ORAL | Status: AC
Start: 2020-11-08 — End: 2020-11-08
  Administered 2020-11-08: 16:00:00 via ORAL

## 2020-11-08 MED ORDER — ONDANSETRON HCL 4 MG/2ML IJ SOLN
4 MG/2ML | Freq: Once | INTRAMUSCULAR | Status: DC
Start: 2020-11-08 — End: 2020-11-08

## 2020-11-08 MED ORDER — NAPROXEN 500 MG PO TABS
500 MG | ORAL_TABLET | Freq: Two times a day (BID) | ORAL | 0 refills | Status: AC | PRN
Start: 2020-11-08 — End: 2020-11-18

## 2020-11-08 MED FILL — FENTANYL CITRATE (PF) 100 MCG/2ML IJ SOLN: 100 MCG/2ML | INTRAMUSCULAR | Qty: 2

## 2020-11-08 MED FILL — OXYCODONE-ACETAMINOPHEN 5-325 MG PO TABS: 5-325 mg | ORAL | Qty: 1

## 2020-11-08 MED FILL — ONDANSETRON HCL 4 MG/2ML IJ SOLN: 4 MG/2ML | INTRAMUSCULAR | Qty: 2

## 2020-11-08 MED FILL — BOOSTRIX 5-2.5-18.5 LF-MCG/0.5 IM SUSY: 5-2.5-18.5 LF-MCG/0.5 | INTRAMUSCULAR | Qty: 0.5
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
## 2020-11-09 ENCOUNTER — Inpatient Hospital Stay: Admit: 2020-11-09 | Discharge: 2020-11-09 | Disposition: A | Discharge status: Home / Self Care | Payer: MEDICAID

## 2020-11-09 DIAGNOSIS — S161XXA Strain of muscle, fascia and tendon at neck level, initial encounter: Secondary | ICD-10-CM

## 2020-11-09 MED ORDER — METHOCARBAMOL 500 MG PO TABS
500 MG | ORAL_TABLET | Freq: Four times a day (QID) | ORAL | 0 refills | Status: DC | PRN
Start: 2020-11-09 — End: 2020-11-10

## 2020-11-09 MED ORDER — LIDOCAINE 5 % EX PTCH
5 % | MEDICATED_PATCH | Freq: Every day | CUTANEOUS | 0 refills | Status: AC
Start: 2020-11-09 — End: 2020-11-19

## 2020-11-09 MED ORDER — ORPHENADRINE CITRATE 30 MG/ML IJ SOLN
30 MG/ML | Freq: Once | INTRAMUSCULAR | Status: AC
Start: 2020-11-09 — End: 2020-11-09
  Administered 2020-11-09: 14:00:00 via INTRAMUSCULAR

## 2020-11-09 MED FILL — ORPHENADRINE CITRATE 30 MG/ML IJ SOLN: 30 mg/mL | INTRAMUSCULAR | Qty: 2
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
## 2020-11-09 NOTE — Discharge Instructions (Signed)
Please return to the ED with new or worsening symptoms. Follow up with PCP for recheck.

## 2020-11-10 ENCOUNTER — Emergency Department: Admit: 2020-11-10 | Payer: MEDICAID

## 2020-11-10 ENCOUNTER — Inpatient Hospital Stay
Admit: 2020-11-10 | Discharge: 2020-11-10 | Disposition: A | Discharge status: Home / Self Care | Payer: MEDICAID | Attending: Emergency Medicine

## 2020-11-10 DIAGNOSIS — S161XXA Strain of muscle, fascia and tendon at neck level, initial encounter: Secondary | ICD-10-CM

## 2020-11-10 LAB — POC PREGNANCY UR-QUAL: HCG, Urine, POC: NEGATIVE

## 2020-11-10 MED ORDER — DIAZEPAM 5 MG PO TABS
5 MG | Freq: Once | ORAL | Status: AC
Start: 2020-11-10 — End: 2020-11-10
  Administered 2020-11-10: 18:00:00 via ORAL

## 2020-11-10 MED ORDER — DIAZEPAM 5 MG PO TABS
5 MG | ORAL_TABLET | Freq: Three times a day (TID) | ORAL | 0 refills | Status: DC | PRN
Start: 2020-11-10 — End: 2020-11-10

## 2020-11-10 MED ORDER — KETOROLAC TROMETHAMINE 30 MG/ML IJ SOLN
30 MG/ML | Freq: Once | INTRAMUSCULAR | Status: AC
Start: 2020-11-10 — End: 2020-11-10
  Administered 2020-11-10: 20:00:00 via INTRAMUSCULAR

## 2020-11-10 MED FILL — DIAZEPAM 5 MG PO TABS: 5 mg | ORAL | Qty: 1

## 2020-11-10 MED FILL — KETOROLAC TROMETHAMINE 30 MG/ML IJ SOLN: 30 mg/mL | INTRAMUSCULAR | Qty: 1
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
## 2020-11-10 NOTE — ED Notes (Signed)
Patient off floor to CT     Mauricia Area, RN  11/10/20 1226

## 2020-11-10 NOTE — ED Notes (Signed)
Patient came to the emergency department with complaints of pain in head and neck patient states she was light headed dizzy and vomiting last night from prescribed muscle relaxer. Patient currently connected to monitor able to make needs known.     Mauricia Area, RN  11/10/20 1342

## 2021-07-21 ENCOUNTER — Emergency Department (HOSPITAL_COMMUNITY)
Admission: EM | Admit: 2021-07-21 | Discharge: 2021-07-21 | Disposition: A | Discharge status: Home / Self Care | Payer: Medicaid - Out of State | Attending: Emergency Medicine | Admitting: Emergency Medicine

## 2021-07-21 ENCOUNTER — Emergency Department (HOSPITAL_COMMUNITY): Payer: Medicaid - Out of State

## 2021-07-21 ENCOUNTER — Other Ambulatory Visit: Payer: Self-pay

## 2021-07-21 ENCOUNTER — Encounter (HOSPITAL_COMMUNITY): Payer: Self-pay

## 2021-07-21 DIAGNOSIS — J069 Acute upper respiratory infection, unspecified: Secondary | ICD-10-CM | POA: Insufficient documentation

## 2021-07-21 DIAGNOSIS — Z20822 Contact with and (suspected) exposure to covid-19: Secondary | ICD-10-CM | POA: Insufficient documentation

## 2021-07-21 DIAGNOSIS — R059 Cough, unspecified: Secondary | ICD-10-CM | POA: Diagnosis present

## 2021-07-21 LAB — RESP PANEL BY RT-PCR (FLU A&B, COVID) ARPGX2
Influenza A by PCR: NEGATIVE
Influenza B by PCR: NEGATIVE
SARS Coronavirus 2 by RT PCR: NEGATIVE

## 2021-07-21 MED ORDER — ALBUTEROL SULFATE HFA 108 (90 BASE) MCG/ACT IN AERS
4.0000 | INHALATION_SPRAY | Freq: Once | RESPIRATORY_TRACT | Status: AC
  Administered 2021-07-21: 4 via RESPIRATORY_TRACT
  Filled 2021-07-21: qty 6.7

## 2021-07-21 MED ORDER — PREDNISONE 50 MG PO TABS: ORAL_TABLET | ORAL | 0 refills | Status: DC

## 2021-07-21 NOTE — ED Triage Notes (Signed)
Pt presents with cough and congestion x 3 days.  ?

## 2021-07-21 NOTE — Discharge Instructions (Addendum)
Use inhaler 2 puffs every 4 hours

## 2021-07-21 NOTE — ED Provider Notes (Signed)
[facility]-EMERGENCY DEPT Provider Note   CSN: [id number] Arrival date & time: 07/21/21  1153     History Chief Complaint  Patient presents with   Cough    Julia Benson is a 20 y.o. female.  The history is provided by the patient. No language interpreter was used.  Cough Cough characteristics:  Non-productive Sputum characteristics:  Nondescript Severity:  Moderate Onset quality:  Gradual Duration:  3 days Timing:  Constant Progression:  Worsening Chronicity:  New Smoker: no   Relieved by:  Nothing Worsened by:  Nothing Ineffective treatments:  None tried Associated symptoms: fever and shortness of breath   Risk factors: no recent infection       History reviewed. No pertinent past medical history.  There are no problems to display for this patient.   History reviewed. No pertinent surgical history.   OB History   No obstetric history on file.     History reviewed. No pertinent family history.     Home Medications Prior to Admission medications   Not on File    Allergies    Patient has no known allergies.  Review of Systems   Review of Systems  Constitutional:  Positive for fever.  Respiratory:  Positive for cough and shortness of breath.   All other systems reviewed and are negative.  Physical Exam Updated Vital Signs BP (!) 109/96 (BP Location: Left Arm)   Pulse 87   Temp 99.1 F (37.3 C) (Oral)   Resp 16   SpO2 95%   Physical Exam Vitals and nursing note reviewed.  Constitutional:      Appearance: She is well-developed.  HENT:     Head: Normocephalic.     Right Ear: Tympanic membrane normal.     Left Ear: Tympanic membrane normal.     Mouth/Throat:     Mouth: Mucous membranes are moist.  Cardiovascular:     Rate and Rhythm: Normal rate.  Pulmonary:     Effort: Pulmonary effort is normal.     Breath sounds: Wheezing present.  Abdominal:     General: Abdomen is flat. There is no distension.   Musculoskeletal:        General: Normal range of motion.     Cervical back: Normal range of motion.  Neurological:     Mental Status: She is alert and oriented to person, place, and time.    ED Results / Procedures / Treatments   Labs (all labs ordered are listed, but only abnormal results are displayed) Labs Reviewed  RESP PANEL BY RT-PCR (FLU A&B, COVID) ARPGX2    EKG None  Radiology DG Chest Portable 1 View  Result Date: 07/21/2021 CLINICAL DATA:  Cough for 1 week EXAM: PORTABLE CHEST 1 VIEW COMPARISON:  None. FINDINGS: The heart size and mediastinal contours are within normal limits. No focal airspace consolidation, pleural effusion, or pneumothorax. The visualized skeletal structures are unremarkable. IMPRESSION: No active disease. Electronically Signed   By: Duanne Guess D.O.   On: 07/21/2021 12:43    Procedures Procedures   Medications Ordered in ED Medications  albuterol (VENTOLIN HFA) 108 (90 Base) MCG/ACT inhaler 4 puff (4 puffs Inhalation Given 07/21/21 1250)    ED Course  I have reviewed the triage vital signs and the nursing notes.  Pertinent labs & imaging results that were available during my care of the patient were reviewed by me and considered in my medical decision making (see chart for details).    MDM Rules/Calculators/A&P  MDM:  covid and influenza are negative.  Pt given albuterol  Prednisone x 5 days.   Final Clinical Impression(s) / ED Diagnoses Final diagnoses:  Viral URI with cough    Rx / DC Orders ED Discharge Orders          Ordered    predniSONE (DELTASONE) 50 MG tablet        07/21/21 1401          An After Visit Summary was printed and given to the patient.    Elson Areas, [place] 07/21/21 1402    Terrilee Files, MD 07/21/21 [phone]

## 2021-08-26 ENCOUNTER — Other Ambulatory Visit: Payer: Self-pay

## 2021-08-26 ENCOUNTER — Emergency Department (HOSPITAL_COMMUNITY)
Admission: EM | Admit: 2021-08-26 | Discharge: 2021-08-27 | Disposition: A | Discharge status: Home / Self Care | Payer: Medicaid Other | Attending: Emergency Medicine | Admitting: Emergency Medicine

## 2021-08-26 ENCOUNTER — Encounter (HOSPITAL_COMMUNITY): Payer: Self-pay

## 2021-08-26 DIAGNOSIS — F17210 Nicotine dependence, cigarettes, uncomplicated: Secondary | ICD-10-CM | POA: Insufficient documentation

## 2021-08-26 DIAGNOSIS — R112 Nausea with vomiting, unspecified: Secondary | ICD-10-CM | POA: Insufficient documentation

## 2021-08-26 DIAGNOSIS — R197 Diarrhea, unspecified: Secondary | ICD-10-CM | POA: Insufficient documentation

## 2021-08-26 DIAGNOSIS — N898 Other specified noninflammatory disorders of vagina: Secondary | ICD-10-CM | POA: Insufficient documentation

## 2021-08-26 DIAGNOSIS — R109 Unspecified abdominal pain: Secondary | ICD-10-CM | POA: Insufficient documentation

## 2021-08-26 DIAGNOSIS — Z79899 Other long term (current) drug therapy: Secondary | ICD-10-CM | POA: Insufficient documentation

## 2021-08-26 DIAGNOSIS — Z20822 Contact with and (suspected) exposure to covid-19: Secondary | ICD-10-CM | POA: Insufficient documentation

## 2021-08-26 NOTE — ED Triage Notes (Signed)
Patient arrives from home with complaint of generalized abdominal pain and N/V x 2 days. Pt also reporting dental pain x 3 days.

## 2021-08-26 NOTE — ED Provider Notes (Signed)
Emergency Medicine Provider Triage Evaluation Note  Zionna Homewood , a 20 y.o. female  was evaluated in triage.  Pt complains of nausea, vomiting, diarrhea x2 days associated with generalized abdominal pain.  Admits to mild increased vaginal discharge.  No urinary symptoms.  No previous abdominal operations.  Admits to cough and chills.  Patient also endorses left upper dental pain.   Review of Systems  Positive: N/V/D/abdominal pain Negative: dysuria  Physical Exam  BP (!) 126/53 (BP Location: Right Arm)    Pulse 78    Temp 97.6 F (36.4 C) (Oral)    Resp 19    Ht 5\' 2"  (1.575 m)    Wt 83.5 kg    LMP 08/26/2021 (Exact Date)    SpO2 100%    BMI 33.65 kg/m  Gen:   Awake, no distress   Resp:  Normal effort  MSK:   Moves extremities without difficulty  Other:    Medical Decision Making  Medically screening exam initiated at 11:30 PM.  Appropriate orders placed.  Giovanni Bath was informed that the remainder of the evaluation will be completed by another provider, this initial triage assessment does not replace that evaluation, and the importance of remaining in the ED until their evaluation is complete.  Abdominal labs Covid/influenza test   Alisa Graff 08/26/21 2332    08/28/21, MD 08/27/21 [phone]

## 2021-08-27 LAB — CBC WITH DIFFERENTIAL/PLATELET
Abs Immature Granulocytes: 0.04 10*3/uL (ref 0.00–0.07)
Basophils Absolute: 0.1 10*3/uL (ref 0.0–0.1)
Basophils Relative: 1 %
Eosinophils Absolute: 0.6 10*3/uL — ABNORMAL HIGH (ref 0.0–0.5)
Eosinophils Relative: 5 %
HCT: 40.8 % (ref 36.0–46.0)
Hemoglobin: 12.8 g/dL (ref 12.0–15.0)
Immature Granulocytes: 0 %
Lymphocytes Relative: 26 %
Lymphs Abs: 3.3 10*3/uL (ref 0.7–4.0)
MCH: 28.6 pg (ref 26.0–34.0)
MCHC: 31.4 g/dL (ref 30.0–36.0)
MCV: 91.1 fL (ref 80.0–100.0)
Monocytes Absolute: 1 10*3/uL (ref 0.1–1.0)
Monocytes Relative: 8 %
Neutro Abs: 7.5 10*3/uL (ref 1.7–7.7)
Neutrophils Relative %: 60 %
Platelets: 342 10*3/uL (ref 150–400)
RBC: 4.48 MIL/uL (ref 3.87–5.11)
RDW: 13.9 % (ref 11.5–15.5)
WBC: 12.5 10*3/uL — ABNORMAL HIGH (ref 4.0–10.5)
nRBC: 0 % (ref 0.0–0.2)

## 2021-08-27 LAB — COMPREHENSIVE METABOLIC PANEL
ALT: 10 U/L (ref 0–44)
AST: 16 U/L (ref 15–41)
Albumin: 3.8 g/dL (ref 3.5–5.0)
Alkaline Phosphatase: 48 U/L (ref 38–126)
Anion gap: 7 (ref 5–15)
BUN: 17 mg/dL (ref 6–20)
CO2: 22 mmol/L (ref 22–32)
Calcium: 8.5 mg/dL — ABNORMAL LOW (ref 8.9–10.3)
Chloride: 112 mmol/L — ABNORMAL HIGH (ref 98–111)
Creatinine, Ser: 0.75 mg/dL (ref 0.44–1.00)
GFR, Estimated: >60 mL/min (ref >60)
Glucose, Bld: 86 mg/dL (ref 70–99)
Potassium: 3.6 mmol/L (ref 3.5–5.1)
Sodium: 141 mmol/L (ref 135–145)
Total Bilirubin: 0.5 mg/dL (ref 0.3–1.2)
Total Protein: 6.7 g/dL (ref 6.5–8.1)

## 2021-08-27 LAB — URINALYSIS, ROUTINE W REFLEX MICROSCOPIC
Bilirubin Urine: NEGATIVE
Glucose, UA: NEGATIVE mg/dL
Ketones, ur: NEGATIVE mg/dL
Leukocytes,Ua: NEGATIVE
Nitrite: NEGATIVE
Protein, ur: NEGATIVE mg/dL
RBC / HPF: >50 RBC/hpf — ABNORMAL HIGH (ref 0–5)
Specific Gravity, Urine: 1.023 (ref 1.005–1.030)
pH: 5 (ref 5.0–8.0)

## 2021-08-27 LAB — WET PREP, GENITAL
Clue Cells Wet Prep HPF POC: NONE SEEN
Sperm: NONE SEEN
Trich, Wet Prep: NONE SEEN
WBC, Wet Prep HPF POC: <10 (ref <10)
Yeast Wet Prep HPF POC: NONE SEEN

## 2021-08-27 LAB — I-STAT BETA HCG BLOOD, ED (MC, WL, AP ONLY): I-stat hCG, quantitative: <5 m[IU]/mL (ref <5)

## 2021-08-27 LAB — RESP PANEL BY RT-PCR (FLU A&B, COVID) ARPGX2
Influenza A by PCR: NEGATIVE
Influenza B by PCR: NEGATIVE
SARS Coronavirus 2 by RT PCR: NEGATIVE

## 2021-08-27 LAB — LIPASE, BLOOD: Lipase: 38 U/L (ref 11–51)

## 2021-08-27 MED ORDER — LOPERAMIDE HCL 2 MG PO CAPS: 2.0000 mg | ORAL_CAPSULE | Freq: Every day | ORAL | 0 refills | Status: DC | PRN

## 2021-08-27 MED ORDER — ONDANSETRON HCL 4 MG PO TABS: 4.0000 mg | ORAL_TABLET | Freq: Three times a day (TID) | ORAL | 0 refills | Status: DC | PRN

## 2021-08-27 NOTE — ED Provider Notes (Signed)
[facility]-EMERGENCY DEPT Provider Note   CSN: [id number] Arrival date & time: 08/26/21  2252     History Chief Complaint  Patient presents with   Abdominal Pain    Julia Benson is a 20 y.o. female who presents with 2 days fo n/v/d. She c/o pain in the left abd, radiating to her flank. She denies urinary sxs. She has had some epigastric firmness and thought she might be pregnant. She denies fever, urinary sxs. She has had some abnormal vaginal discharge.   Abdominal Pain     History reviewed. No pertinent past medical history.  There are no problems to display for this patient.   History reviewed. No pertinent surgical history.   OB History   No obstetric history on file.     History reviewed. No pertinent family history.  Social History   Tobacco Use   Smoking status: Every Day    Types: Cigarettes   Smokeless tobacco: Never  Vaping Use   Vaping Use: Never used  Substance Use Topics   Alcohol use: Not Currently   Drug use: Never    Home Medications Prior to Admission medications   Medication Sig Start Date End Date Taking? Authorizing Provider  predniSONE (DELTASONE) 50 MG tablet One tablet a day 07/21/21   Elson Areas, PA-C    Allergies    Patient has no known allergies.  Review of Systems   Review of Systems  Gastrointestinal:  Positive for abdominal pain.   Physical Exam Updated Vital Signs BP 125/83 (BP Location: Left Arm)    Pulse 70    Temp 98.2 F (36.8 C) (Oral)    Resp 16    Ht 5\' 2"  (1.575 m)    Wt 83.5 kg    LMP 08/26/2021 (Exact Date)    SpO2 100%    BMI 33.65 kg/m   Physical Exam Vitals and nursing note reviewed. Exam conducted with a chaperone present 08/28/2021).  Constitutional:      General: She is not in acute distress.    Appearance: She is well-developed. She is not diaphoretic.  HENT:     Head: Normocephalic and atraumatic.     Right Ear: External ear normal.     Left Ear: External ear  normal.     Nose: Nose normal.     Mouth/Throat:     Mouth: Mucous membranes are moist.  Eyes:     General: No scleral icterus.    Conjunctiva/sclera: Conjunctivae normal.  Cardiovascular:     Rate and Rhythm: Normal rate and regular rhythm.     Heart sounds: Normal heart sounds. No murmur heard.   No friction rub. No gallop.  Pulmonary:     Effort: Pulmonary effort is normal. No respiratory distress.     Breath sounds: Normal breath sounds.  Abdominal:     General: Bowel sounds are normal. There is no distension.     Palpations: Abdomen is soft. There is no mass.     Tenderness: There is abdominal tenderness in the suprapubic area. There is no right CVA tenderness, left CVA tenderness or guarding.  Genitourinary:    Vagina: Normal.     Cervix: Cervical bleeding present.     Uterus: Normal.      Adnexa: Right adnexa normal and left adnexa normal.  Musculoskeletal:     Cervical back: Normal range of motion.  Skin:    General: Skin is warm and dry.  Neurological:     Mental Status: She  is alert and oriented to person, place, and time.  Psychiatric:        Behavior: Behavior normal.    ED Results / Procedures / Treatments   Labs (all labs ordered are listed, but only abnormal results are displayed) Labs Reviewed  CBC WITH DIFFERENTIAL/PLATELET - Abnormal; Notable for the following components:      Result Value   WBC 12.5 (*)    Eosinophils Absolute 0.6 (*)    All other components within normal limits  COMPREHENSIVE METABOLIC PANEL - Abnormal; Notable for the following components:   Chloride 112 (*)    Calcium 8.5 (*)    All other components within normal limits  URINALYSIS, ROUTINE W REFLEX MICROSCOPIC - Abnormal; Notable for the following components:   APPearance HAZY (*)    Hgb urine dipstick LARGE (*)    RBC / HPF >50 (*)    Bacteria, UA RARE (*)    All other components within normal limits  RESP PANEL BY RT-PCR (FLU A&B, COVID) ARPGX2  LIPASE, BLOOD  I-STAT BETA  HCG BLOOD, ED ([facility], [facility], [facility] ONLY)    EKG None  Radiology No results found.  Procedures Procedures   Medications Ordered in ED Medications - No data to display  ED Course  I have reviewed the triage vital signs and the nursing notes.  Pertinent labs & imaging results that were available during my care of the patient were reviewed by me and considered in my medical decision making (see chart for details).    MDM Rules/Calculators/A&P                         Patient here with n/v/d. Patient began her menstrual cycle while waiting overnight. The differential diagnosis of diarrhea includes but is not limited to Viral- norovirus/rotavirus; Bacterial-Campylobacter,Shigella, Salmonella, Escherichia coli, E. coli 0157:H7, Yersinia enterocolitica, Vibrio cholerae, Clostridium difficile. Parasitic- Giardia lamblia, Cryptosporidium,Entamoeba histolytica,Cyclospora, Microsporidium. Toxin- Staphylococcus aureus, Bacillus cereus. Noninfectious causes include GI Bleed, Appendicitis, Mesenteric Ischemia, Diverticulitis, Adrenal Crisis, Thyroid Storm, Toxicologic exposures, Antibiotic or drug-associated, inflammatory bowel disease. I ordered and reviewed labs that include CBC with elevated white blood cell count, 12.5.  CMP without significant abnormality urine appears contaminated.  Patient is currently menstruating which accounts for her large amount of blood in the urine.  Respiratory panel is negative for COVID or influenza. Pelvic exam is benign.  Patient has had no active vomiting or diarrhea here.  She is a negative pregnancy test.  Patient may have had of viral infection.  She is a benign abdominal exam.  She appears appropriate for discharge at this time  Final Clinical Impression(s) / ED Diagnoses Final diagnoses:  Nausea vomiting and diarrhea    Rx / DC Orders ED Discharge Orders     None        Arthor Captain, PA-C 08/27/21 1655    Rolan Bucco, MD 08/30/21 2302

## 2021-08-27 NOTE — Discharge Instructions (Signed)
Get help right away if you: Have chest pain. Feel extremely weak or you faint. See blood in your vomit. Have vomit that looks like coffee grounds. Have bloody or black stools or stools that look like tar. Have a severe headache, a stiff neck, or both. Have a rash. Have severe pain, cramping, or bloating in your abdomen. Have trouble breathing or you are breathing very quickly. Have a fast heartbeat. Have skin that feels cold and clammy. Feel confused. Have pain when you urinate. Have signs of dehydration, such as: Dark urine, very little urine, or no urine. Cracked lips. Dry mouth. Sunken eyes. Sleepiness. Weakness. 

## 2021-08-28 LAB — GC/CHLAMYDIA PROBE AMP (~~LOC~~) NOT AT ARMC
Chlamydia: NEGATIVE
Comment: NEGATIVE
Comment: NORMAL
Neisseria Gonorrhea: NEGATIVE

## 2022-02-03 ENCOUNTER — Encounter (HOSPITAL_COMMUNITY): Payer: Self-pay | Admitting: *Deleted

## 2022-02-03 ENCOUNTER — Ambulatory Visit (HOSPITAL_COMMUNITY)
Admission: EM | Admit: 2022-02-03 | Discharge: 2022-02-03 | Disposition: A | Discharge status: Home / Self Care | Payer: Medicaid Other | Attending: Internal Medicine | Admitting: Internal Medicine

## 2022-02-03 ENCOUNTER — Other Ambulatory Visit: Payer: Self-pay

## 2022-02-03 DIAGNOSIS — J302 Other seasonal allergic rhinitis: Secondary | ICD-10-CM

## 2022-02-03 DIAGNOSIS — R0981 Nasal congestion: Secondary | ICD-10-CM

## 2022-02-03 DIAGNOSIS — Z3202 Encounter for pregnancy test, result negative: Secondary | ICD-10-CM

## 2022-02-03 LAB — POCT URINALYSIS DIPSTICK, ED / UC
Bilirubin Urine: NEGATIVE
Glucose, UA: NEGATIVE mg/dL
Hgb urine dipstick: NEGATIVE
Ketones, ur: NEGATIVE mg/dL
Leukocytes,Ua: NEGATIVE
Nitrite: NEGATIVE
Protein, ur: NEGATIVE mg/dL
Specific Gravity, Urine: 1.025 (ref 1.005–1.030)
Urobilinogen, UA: 1 mg/dL (ref 0.0–1.0)
pH: 6.5 (ref 5.0–8.0)

## 2022-02-03 LAB — POC URINE PREG, ED: Preg Test, Ur: NEGATIVE

## 2022-02-03 MED ORDER — CETIRIZINE HCL 10 MG PO TABS: 10.0000 mg | ORAL_TABLET | Freq: Every day | ORAL | 2 refills | Status: DC

## 2022-02-03 MED ORDER — OLOPATADINE HCL 0.1 % OP SOLN: 1.0000 [drp] | Freq: Two times a day (BID) | OPHTHALMIC | 12 refills | Status: AC

## 2022-02-03 MED ORDER — FLUTICASONE PROPIONATE 50 MCG/ACT NA SUSP: 1.0000 | Freq: Every day | NASAL | 2 refills | Status: DC

## 2022-02-03 NOTE — Discharge Instructions (Addendum)
You were seen in urgent care today for your seasonal allergies.  Take cetirizine once daily.  Spray 1 spray of Flonase nasal spray in each nostril once daily.  Use olopatadine eyedrops 1 drop into both eyes twice daily as needed.  Follow-up with urgent care as needed.

## 2022-02-03 NOTE — ED Triage Notes (Signed)
Pt reports having a cough and congestion. Pt reports she has a sinus infection. Pt reports Sx's started 3 days ago. Pt also reports nausea.

## 2022-02-03 NOTE — ED Provider Notes (Signed)
[facility]    CSN: [id number] Arrival date & time: 02/03/22  1422      History   Chief Complaint Chief Complaint  Patient presents with   Nasal Congestion   Cough    HPI Jonetta Dagley is a 21 y.o. female.   Patient presents urgent care for evaluation of nasal congestion, watery/itchy eyes, ear itching, throat itching and cough that started 3 days ago.  She states that this is her first allergy season in [place] and attributes symptoms to seasonal allergies.  Denies fever/chills, nausea, vomiting, urinary symptoms, dizziness, headache, and ear pain.  Denies history of asthma.  Denies sore throat, difficulty swallowing, and eye pain.  She has not taken any medications for her symptoms over-the-counter at home.  No other aggravating or relieving factors identified at this time.   Cough  History reviewed. No pertinent past medical history.  There are no problems to display for this patient.   History reviewed. No pertinent surgical history.  OB History   No obstetric history on file.      Home Medications    Prior to Admission medications   Medication Sig Start Date End Date Taking? Authorizing Provider  loperamide (IMODIUM) 2 MG capsule Take 1-2 capsules (2-4 mg total) by mouth daily as needed for diarrhea or loose stools. 08/27/21   Arthor Captain, PA-C  ondansetron (ZOFRAN) 4 MG tablet Take 1 tablet (4 mg total) by mouth every 8 (eight) hours as needed for nausea or vomiting. 08/27/21   Arthor Captain, PA-C  predniSONE (DELTASONE) 50 MG tablet One tablet a day 07/21/21   Elson Areas, PA-C    Family History History reviewed. No pertinent family history.  Social History Social History   Tobacco Use   Smoking status: Every Day    Types: Cigarettes   Smokeless tobacco: Never  Vaping Use   Vaping Use: Never used  Substance Use Topics   Alcohol use: Not Currently   Drug use: Never     Allergies   Patient has no known  allergies.   Review of Systems Review of Systems  Respiratory:  Positive for cough.   Per HPI  Physical Exam Triage Vital Signs ED Triage Vitals  Enc Vitals Group     BP 02/03/22 1521 110/76     Pulse Rate 02/03/22 1521 75     Resp 02/03/22 1521 18     Temp 02/03/22 1521 98.3 F (36.8 C)     Temp src --      SpO2 02/03/22 1521 99 %     Weight --      Height --      Head Circumference --      Peak Flow --      Pain Score 02/03/22 1518 0     Pain Loc --      Pain Edu? --      Excl. in GC? --    No data found.  Updated Vital Signs BP 110/76   Pulse 75   Temp 98.3 F (36.8 C)   Resp 18   LMP  (LMP Unknown)   SpO2 99%   Visual Acuity Right Eye Distance:   Left Eye Distance:   Bilateral Distance:    Right Eye Near:   Left Eye Near:    Bilateral Near:     Physical Exam Vitals and nursing note reviewed.  Constitutional:      General: She is not in acute distress.    Appearance: Normal  appearance. She is well-developed. She is not ill-appearing.  HENT:     Head: Normocephalic and atraumatic.     Right Ear: Tympanic membrane, ear canal and external ear normal.     Left Ear: Tympanic membrane, ear canal and external ear normal.     Nose: Rhinorrhea present. No congestion.     Mouth/Throat:     Mouth: Mucous membranes are moist.     Pharynx: Posterior oropharyngeal erythema present.     Comments: Mild erythema to posterior oropharynx with small amount of clear postnasal drainage visualized. Airway intact and patent. Eyes:     General:        Right eye: No discharge.        Left eye: No discharge.     Extraocular Movements: Extraocular movements intact.     Conjunctiva/sclera: Conjunctivae normal.  Cardiovascular:     Rate and Rhythm: Normal rate and regular rhythm.     Heart sounds: Normal heart sounds. No murmur heard.   No friction rub. No gallop.  Pulmonary:     Effort: Pulmonary effort is normal. No respiratory distress.     Breath sounds: Normal  breath sounds. No wheezing, rhonchi or rales.  Chest:     Chest wall: No tenderness.  Abdominal:     Palpations: Abdomen is soft.  Musculoskeletal:        General: No swelling.     Cervical back: Neck supple.  Lymphadenopathy:     Cervical: No cervical adenopathy.  Skin:    General: Skin is warm and dry.     Capillary Refill: Capillary refill takes less than 2 seconds.     Findings: No rash.  Neurological:     General: No focal deficit present.     Mental Status: She is alert and oriented to person, place, and time.  Psychiatric:        Mood and Affect: Mood normal.        Behavior: Behavior normal.        Thought Content: Thought content normal.        Judgment: Judgment normal.     UC Treatments / Results  Labs (all labs ordered are listed, but only abnormal results are displayed) Labs Reviewed  POCT URINALYSIS DIPSTICK, ED / UC  POC URINE PREG, ED    EKG   Radiology No results found.  Procedures Procedures (including critical care time)  Medications Ordered in UC Medications - No data to display  Initial Impression / Assessment and Plan / UC Course  I have reviewed the triage vital signs and the nursing notes.  Pertinent labs & imaging results that were available during my care of the patient were reviewed by me and considered in my medical decision making (see chart for details).  Patient is a 21 year old female presenting to urgent care today for evaluation of allergy symptoms.  Doubt viral or bacterial etiology of patient's symptoms at this time.  Deferred imaging based on stable cardiopulmonary exam.  Patient remains afebrile in clinic at and at home.  Plan to treat allergic rhinitis with cetirizine 10 mg to be taken once daily and Flonase 1 spray in each nostril daily.  Olopatadine eyedrops prescribed to be used 1 drop into each eye twice daily as needed.  Patient complained of nausea to nursing staff.  Urinalysis is negative for urinary tract infection.   Urine pregnancy test is negative as well.  Counseled patient regarding appropriate use of medications and potential side effects for all medications  recommended or prescribed today. Discussed red flag signs and symptoms of worsening condition,when to call the PCP office, return to urgent care, and when to seek higher level of care. Patient verbalizes understanding and agreement with plan. All questions answered. Patient discharged in stable condition.  Final Clinical Impressions(s) / UC Diagnoses   Final diagnoses:  Seasonal allergies  Nasal congestion     Discharge Instructions      You were seen in urgent care today for your seasonal allergies.  Take cetirizine once daily.  Spray 1 spray of Flonase nasal spray in each nostril once daily.  Use olopatadine eyedrops 1 drop into both eyes twice daily as needed.  Follow-up with urgent care as needed.     ED Prescriptions   None    PDMP not reviewed this encounter.   Carlisle BeersStanhope, Kayal Mula M, [place]FNP 02/03/22 [phone]

## 2022-02-07 ENCOUNTER — Ambulatory Visit
Admission: EM | Admit: 2022-02-07 | Discharge: 2022-02-07 | Disposition: A | Discharge status: Home / Self Care | Payer: Medicaid Other

## 2022-02-07 DIAGNOSIS — J069 Acute upper respiratory infection, unspecified: Secondary | ICD-10-CM

## 2022-02-07 NOTE — ED Provider Notes (Signed)
[facility]    CSN: [id number] Arrival date & time: 02/07/22  1311      History   Chief Complaint Chief Complaint  Patient presents with  . Cough  . Headache    HPI Julia Benson is a 21 y.o. female.   Pleasant 21 year old female who moved here from [place] last year presents today with concerns of continued URI symptoms.  She was seen on 5/27 due to itchy ears, itchy eyes, scratchy throat.  She was started on Flonase and oloptadine eye drops.  She was also prescribed cetirizine, but states it was not covered by her insurance thus did not pick it up.  She endorses many of the symptoms are still present, but not any worse than previous.  Her main symptom today is an intermittent headache.  She states it started yesterday and is primarily around her left eye.  She states her eye will intermittently twitch.  She states she had a headache in the waiting room, but it is since resolved.  She states it comes and goes without any treatment.  She does have a history of migraines but feels this is different.  She usually takes Tylenol PM for her migraines.  She denies any fever.  She has an intermittent dry cough without shortness of breath or chest pain.   Cough Associated symptoms: headaches   Headache Associated symptoms: cough    History reviewed. No pertinent past medical history.  There are no problems to display for this patient.   History reviewed. No pertinent surgical history.  OB History   No obstetric history on file.      Home Medications    Prior to Admission medications   Medication Sig Start Date End Date Taking? Authorizing Provider  fluticasone (FLONASE) 50 MCG/ACT nasal spray Place 1 spray into both nostrils daily. 02/03/22   Carlisle Beers, FNP  olopatadine (PATANOL) 0.1 % ophthalmic solution Place 1 drop into both eyes 2 (two) times daily. 02/03/22   Carlisle Beers, FNP    Family History History reviewed. No pertinent family  history.  Social History Social History   Tobacco Use  . Smoking status: Every Day    Types: Cigarettes  . Smokeless tobacco: Never  Vaping Use  . Vaping Use: Never used  Substance Use Topics  . Alcohol use: Not Currently  . Drug use: Never     Allergies   Patient has no known allergies.   Review of Systems Review of Systems  Respiratory:  Positive for cough.   Neurological:  Positive for headaches.    Physical Exam Triage Vital Signs ED Triage Vitals [02/07/22 1323]  Enc Vitals Group     BP 124/84     Pulse Rate 67     Resp 18     Temp 97.9 F (36.6 C)     Temp Source Oral     SpO2 98 %     Weight      Height      Head Circumference      Peak Flow      Pain Score      Pain Loc      Pain Edu?      Excl. in GC?    No data found.  Updated Vital Signs BP 124/84 (BP Location: Left Arm)   Pulse 67   Temp 97.9 F (36.6 C) (Oral)   Resp 18   LMP  (LMP Unknown)   SpO2 98%   Visual Acuity Right  Eye Distance:   Left Eye Distance:   Bilateral Distance:    Right Eye Near:   Left Eye Near:    Bilateral Near:     Physical Exam   UC Treatments / Results  Labs (all labs ordered are listed, but only abnormal results are displayed) Labs Reviewed - No data to display  EKG   Radiology No results found.  Procedures Procedures (including critical care time)  Medications Ordered in UC Medications - No data to display  Initial Impression / Assessment and Plan / UC Course  I have reviewed the triage vital signs and the nursing notes.  Pertinent labs & imaging results that were available during my care of the patient were reviewed by me and considered in my medical decision making (see chart for details).     *** Final Clinical Impressions(s) / UC Diagnoses   Final diagnoses:  Viral upper respiratory tract infection     Discharge Instructions      Go to [facility] and purchase Chlorpheniramine 4mg  tab every 6-8 hours. This will help with  your stuffy nose. Continue your nasal spray and eye drops prescribed on the 27th. If your headache comes back, please try ibuprofen (also known as motrin or advil) 600mg  (which is 3 tabs) with food. You can take this every 6-8 hours. Saline nasal spray would be helpful.    ED Prescriptions   None    PDMP not reviewed this encounter.

## 2022-02-07 NOTE — Discharge Instructions (Addendum)
Go to [facility] and purchase Chlorpheniramine 4mg  tab every 6-8 hours. This will help with your stuffy nose. Continue your nasal spray and eye drops prescribed on the 27th. If your headache comes back, please try ibuprofen (also known as motrin or advil) 600mg  (which is 3 tabs) with food. You can take this every 6-8 hours. Saline nasal spray would be helpful.

## 2022-02-07 NOTE — ED Triage Notes (Signed)
Pt present cough with congestion, symptoms started three days ago. Pt was recently seen at the other location for same symptoms.

## 2022-07-07 ENCOUNTER — Emergency Department (HOSPITAL_COMMUNITY): Payer: Self-pay

## 2022-07-07 ENCOUNTER — Emergency Department (HOSPITAL_COMMUNITY)
Admission: EM | Admit: 2022-07-07 | Discharge: 2022-07-08 | Discharge status: Against Medical Advice | Payer: Self-pay | Attending: Emergency Medicine | Admitting: Emergency Medicine

## 2022-07-07 ENCOUNTER — Other Ambulatory Visit: Payer: Self-pay

## 2022-07-07 ENCOUNTER — Encounter (HOSPITAL_COMMUNITY): Payer: Self-pay

## 2022-07-07 DIAGNOSIS — R102 Pelvic and perineal pain: Secondary | ICD-10-CM | POA: Insufficient documentation

## 2022-07-07 DIAGNOSIS — R079 Chest pain, unspecified: Secondary | ICD-10-CM | POA: Insufficient documentation

## 2022-07-07 DIAGNOSIS — Z5321 Procedure and treatment not carried out due to patient leaving prior to being seen by health care provider: Secondary | ICD-10-CM | POA: Insufficient documentation

## 2022-07-07 DIAGNOSIS — R1031 Right lower quadrant pain: Secondary | ICD-10-CM | POA: Insufficient documentation

## 2022-07-07 DIAGNOSIS — R519 Headache, unspecified: Secondary | ICD-10-CM | POA: Insufficient documentation

## 2022-07-07 DIAGNOSIS — R220 Localized swelling, mass and lump, head: Secondary | ICD-10-CM | POA: Insufficient documentation

## 2022-07-07 DIAGNOSIS — R55 Syncope and collapse: Secondary | ICD-10-CM | POA: Insufficient documentation

## 2022-07-07 LAB — COMPREHENSIVE METABOLIC PANEL
ALT: 12 U/L (ref 0–44)
AST: 16 U/L (ref 15–41)
Albumin: 4.4 g/dL (ref 3.5–5.0)
Alkaline Phosphatase: 50 U/L (ref 38–126)
Anion gap: 8 (ref 5–15)
BUN: 14 mg/dL (ref 6–20)
CO2: 24 mmol/L (ref 22–32)
Calcium: 8.8 mg/dL — ABNORMAL LOW (ref 8.9–10.3)
Chloride: 106 mmol/L (ref 98–111)
Creatinine, Ser: 0.85 mg/dL (ref 0.44–1.00)
GFR, Estimated: >60 mL/min (ref >60)
Glucose, Bld: 90 mg/dL (ref 70–99)
Potassium: 3.5 mmol/L (ref 3.5–5.1)
Sodium: 138 mmol/L (ref 135–145)
Total Bilirubin: 0.8 mg/dL (ref 0.3–1.2)
Total Protein: 7.5 g/dL (ref 6.5–8.1)

## 2022-07-07 LAB — CBC WITH DIFFERENTIAL/PLATELET
Abs Immature Granulocytes: 0.02 10*3/uL (ref 0.00–0.07)
Basophils Absolute: 0.1 10*3/uL (ref 0.0–0.1)
Basophils Relative: 1 %
Eosinophils Absolute: 0.3 10*3/uL (ref 0.0–0.5)
Eosinophils Relative: 3 %
HCT: 40.6 % (ref 36.0–46.0)
Hemoglobin: 13.2 g/dL (ref 12.0–15.0)
Immature Granulocytes: 0 %
Lymphocytes Relative: 21 %
Lymphs Abs: 2.6 10*3/uL (ref 0.7–4.0)
MCH: 29.9 pg (ref 26.0–34.0)
MCHC: 32.5 g/dL (ref 30.0–36.0)
MCV: 91.9 fL (ref 80.0–100.0)
Monocytes Absolute: 1 10*3/uL (ref 0.1–1.0)
Monocytes Relative: 8 %
Neutro Abs: 8.2 10*3/uL — ABNORMAL HIGH (ref 1.7–7.7)
Neutrophils Relative %: 67 %
Platelets: 309 10*3/uL (ref 150–400)
RBC: 4.42 MIL/uL (ref 3.87–5.11)
RDW: 13.5 % (ref 11.5–15.5)
WBC: 12.2 10*3/uL — ABNORMAL HIGH (ref 4.0–10.5)
nRBC: 0 % (ref 0.0–0.2)

## 2022-07-07 LAB — HCG, QUANTITATIVE, PREGNANCY: hCG, Beta Chain, Quant, S: <1 m[IU]/mL (ref <5)

## 2022-07-07 LAB — LIPASE, BLOOD: Lipase: 58 U/L — ABNORMAL HIGH (ref 11–51)

## 2022-07-07 LAB — ETHANOL: Alcohol, Ethyl (B): <10 mg/dL (ref <10)

## 2022-07-07 LAB — TROPONIN I (HIGH SENSITIVITY): Troponin I (High Sensitivity): <2 ng/L (ref <18)

## 2022-07-07 NOTE — ED Provider Triage Note (Signed)
Emergency Medicine Provider Triage Evaluation Note  Julia Benson , a 21 y.o. female  was evaluated in triage.  Pt complains of syncope.  Per EMS, patient reportedly had lower abdominal or pelvic pain followed by syncopal episode.  I interviewed the patient, she states that she did not have any belly pain but did have some chest pain headache prior to this syncopal episode.  She also reports that she is done a lot of marijuana tonight.  She also reports bumps all over the back of her head that she has noticed and she feels she needs to go to a neurosurgeon or Derm just.    She seems very intoxicated right now.   Review of Systems  Positive:  Negative:   Physical Exam  BP 122/71 (BP Location: Left Arm)   Pulse 77   Temp 98.2 F (36.8 C) (Oral)   Resp 19   Ht 5\' 2"  (1.575 m)   Wt 81.6 kg   SpO2 99%   BMI 32.92 kg/m  Gen:   Awake, no distress   Resp:  Normal effort  MSK:   Moves extremities without difficulty  Other:  Mild pelvic tenderness. Not focal to one side. Bilateral conjunctival injection. Seems clinically intoxicated.  Medical Decision Making  Medically screening exam initiated at 6:10 PM.  Appropriate orders placed.  Julia Benson was informed that the remainder of the evaluation will be completed by another provider, this initial triage assessment does not replace that evaluation, and the importance of remaining in the ED until their evaluation is complete.     Adolphus Birchwood, [place] 07/07/22 1813

## 2022-07-07 NOTE — ED Triage Notes (Signed)
Patient BIB [facility] from home. Has right lower quadrant abdominal pain. Smoked some weed before eating. Stood up then passed out and lost consciousness.

## 2022-09-02 ENCOUNTER — Emergency Department (HOSPITAL_COMMUNITY): Payer: Self-pay

## 2022-09-02 ENCOUNTER — Emergency Department (HOSPITAL_COMMUNITY)
Admission: EM | Admit: 2022-09-02 | Discharge: 2022-09-03 | Disposition: A | Discharge status: Home / Self Care | Payer: Self-pay | Attending: Emergency Medicine | Admitting: Emergency Medicine

## 2022-09-02 DIAGNOSIS — Z1152 Encounter for screening for COVID-19: Secondary | ICD-10-CM | POA: Insufficient documentation

## 2022-09-02 DIAGNOSIS — R55 Syncope and collapse: Secondary | ICD-10-CM | POA: Insufficient documentation

## 2022-09-02 LAB — CBC WITH DIFFERENTIAL/PLATELET
Abs Immature Granulocytes: 0.06 10*3/uL (ref 0.00–0.07)
Basophils Absolute: 0.1 10*3/uL (ref 0.0–0.1)
Basophils Relative: 1 %
Eosinophils Absolute: 0.4 10*3/uL (ref 0.0–0.5)
Eosinophils Relative: 3 %
HCT: 42.3 % (ref 36.0–46.0)
Hemoglobin: 13.8 g/dL (ref 12.0–15.0)
Immature Granulocytes: 1 %
Lymphocytes Relative: 14 %
Lymphs Abs: 1.9 10*3/uL (ref 0.7–4.0)
MCH: 29.7 pg (ref 26.0–34.0)
MCHC: 32.6 g/dL (ref 30.0–36.0)
MCV: 91 fL (ref 80.0–100.0)
Monocytes Absolute: 1.1 10*3/uL — ABNORMAL HIGH (ref 0.1–1.0)
Monocytes Relative: 8 %
Neutro Abs: 9.7 10*3/uL — ABNORMAL HIGH (ref 1.7–7.7)
Neutrophils Relative %: 73 %
Platelets: 297 10*3/uL (ref 150–400)
RBC: 4.65 MIL/uL (ref 3.87–5.11)
RDW: 13.9 % (ref 11.5–15.5)
WBC: 13.2 10*3/uL — ABNORMAL HIGH (ref 4.0–10.5)
nRBC: 0 % (ref 0.0–0.2)

## 2022-09-02 LAB — URINALYSIS, ROUTINE W REFLEX MICROSCOPIC
Bilirubin Urine: NEGATIVE
Glucose, UA: NEGATIVE mg/dL
Hgb urine dipstick: NEGATIVE
Ketones, ur: NEGATIVE mg/dL
Leukocytes,Ua: NEGATIVE
Nitrite: NEGATIVE
Protein, ur: NEGATIVE mg/dL
Specific Gravity, Urine: 1.015 (ref 1.005–1.030)
pH: 6 (ref 5.0–8.0)

## 2022-09-02 LAB — BASIC METABOLIC PANEL
Anion gap: 10 (ref 5–15)
BUN: 9 mg/dL (ref 6–20)
CO2: 23 mmol/L (ref 22–32)
Calcium: 9.2 mg/dL (ref 8.9–10.3)
Chloride: 108 mmol/L (ref 98–111)
Creatinine, Ser: 0.83 mg/dL (ref 0.44–1.00)
GFR, Estimated: >60 mL/min (ref >60)
Glucose, Bld: 101 mg/dL — ABNORMAL HIGH (ref 70–99)
Potassium: 3.8 mmol/L (ref 3.5–5.1)
Sodium: 141 mmol/L (ref 135–145)

## 2022-09-02 LAB — CBG MONITORING, ED: Glucose-Capillary: 109 mg/dL — ABNORMAL HIGH (ref 70–99)

## 2022-09-02 LAB — I-STAT BETA HCG BLOOD, ED (MC, WL, AP ONLY): I-stat hCG, quantitative: <5 m[IU]/mL (ref <5)

## 2022-09-02 NOTE — ED Provider Triage Note (Signed)
Emergency Medicine Provider Triage Evaluation Note  Julia Benson , a 21 y.o. female  was evaluated in triage.  Pt complains of syncope today. States she was outside, starting feeling hot, so came into house, fiance, saw her fall to the ground--she was "out" for 15 seconds and then really out of it per fiance for around 3 minutes. Denies head trauma--fiance caught her before she fell.  Review of Systems  Positive: syncope Negative: fever  Physical Exam  BP (!) 147/94   Pulse 92   Temp 97.9 F (36.6 C)   Resp 16   SpO2 100%  Gen:   Awake, no distress   Resp:  Normal effort  MSK:   Moves extremities without difficulty   Medical Decision Making  Medically screening exam initiated at 10:33 PM.  Appropriate orders placed.  Ian Cavey was informed that the remainder of the evaluation will be completed by another provider, this initial triage assessment does not replace that evaluation, and the importance of remaining in the ED until their evaluation is complete.    Pete Pelt, [place] 09/02/22 2234

## 2022-09-02 NOTE — ED Triage Notes (Signed)
Pt to ED c/o syncopal episode , history of the same. Reports occurred aprox 2 hours. Did not fall, did not hit head.

## 2022-09-03 LAB — RESP PANEL BY RT-PCR (RSV, FLU A&B, COVID)  RVPGX2
Influenza A by PCR: NEGATIVE
Influenza B by PCR: NEGATIVE
Resp Syncytial Virus by PCR: NEGATIVE
SARS Coronavirus 2 by RT PCR: NEGATIVE

## 2022-09-03 NOTE — ED Notes (Signed)
Orthostatics as follows: Laying BP 128/69, HR 79 Sitting BP 120/89, HR 77 Standing BP 121/99, HR 82

## 2022-09-03 NOTE — Discharge Instructions (Signed)
The passing out spell you had today does not sound like a seizure.  You do tell me however that you have had a seizure in the past.  It is therefore best that you do not drive, follow-up with neurology.  A referral has been placed, you will be contacted for follow-up.  Contact the dermatology department at [facility], [employer] [facility] [employer] or [facility] to get an outpatient follow-up for the bumps on your skin.

## 2022-09-03 NOTE — ED Notes (Signed)
Patient does not have a pacemaker. 

## 2022-09-03 NOTE — ED Provider Notes (Signed)
[facility] EMERGENCY DEPARTMENT Provider Note   CSN: [id number] Arrival date & time: 09/02/22  2201     History  Chief Complaint  Patient presents with   Loss of Consciousness    Julia Benson is a 21 y.o. female.  Patient presents to the emergency department for evaluation of syncopal episode.  Patient reports that she has had a prior syncopal episode.  She reports that tonight she had onset of a ringing in her ears, then felt very hot and flushed, then passed out.  No urinary incontinence.  No seizure-like activity.  No injury.       Home Medications Prior to Admission medications   Medication Sig Start Date End Date Taking? Authorizing Provider  fluticasone (FLONASE) 50 MCG/ACT nasal spray Place 1 spray into both nostrils daily. 02/03/22   Carlisle Beers, FNP  olopatadine (PATANOL) 0.1 % ophthalmic solution Place 1 drop into both eyes 2 (two) times daily. 02/03/22   Carlisle Beers, FNP      Allergies    Patient has no known allergies.    Review of Systems   Review of Systems  Physical Exam Updated Vital Signs BP (!) 147/94   Pulse 92   Temp 97.9 F (36.6 C)   Resp 16   Ht 5\' 2"  (1.575 m)   Wt 81.6 kg   SpO2 100%   BMI 32.92 kg/m  Physical Exam Vitals and nursing note reviewed.  Constitutional:      General: She is not in acute distress.    Appearance: She is well-developed.  HENT:     Head: Normocephalic and atraumatic.     Mouth/Throat:     Mouth: Mucous membranes are moist.  Eyes:     General: Vision grossly intact. Gaze aligned appropriately.     Extraocular Movements: Extraocular movements intact.     Conjunctiva/sclera: Conjunctivae normal.  Cardiovascular:     Rate and Rhythm: Normal rate and regular rhythm.     Pulses: Normal pulses.     Heart sounds: Normal heart sounds, S1 normal and S2 normal. No murmur heard.    No friction rub. No gallop.  Pulmonary:     Effort: Pulmonary effort is normal. No  respiratory distress.     Breath sounds: Normal breath sounds.  Abdominal:     General: Bowel sounds are normal.     Palpations: Abdomen is soft.     Tenderness: There is no abdominal tenderness. There is no guarding or rebound.     Hernia: No hernia is present.  Musculoskeletal:        General: No swelling.     Cervical back: Full passive range of motion without pain, normal range of motion and neck supple. No spinous process tenderness or muscular tenderness. Normal range of motion.     Right lower leg: No edema.     Left lower leg: No edema.  Skin:    General: Skin is warm and dry.     Capillary Refill: Capillary refill takes less than 2 seconds.     Findings: No ecchymosis, erythema, rash or wound.  Neurological:     General: No focal deficit present.     Mental Status: She is alert and oriented to person, place, and time.     GCS: GCS eye subscore is 4. GCS verbal subscore is 5. GCS motor subscore is 6.     Cranial Nerves: Cranial nerves 2-12 are intact.     Sensory: Sensation is intact.  Motor: Motor function is intact.     Coordination: Coordination is intact.  Psychiatric:        Attention and Perception: Attention normal.        Mood and Affect: Mood normal.        Speech: Speech normal.        Behavior: Behavior normal.     ED Results / Procedures / Treatments   Labs (all labs ordered are listed, but only abnormal results are displayed) Labs Reviewed  BASIC METABOLIC PANEL - Abnormal; Notable for the following components:      Result Value   Glucose, Bld 101 (*)    All other components within normal limits  CBC WITH DIFFERENTIAL/PLATELET - Abnormal; Notable for the following components:   WBC 13.2 (*)    Neutro Abs 9.7 (*)    Monocytes Absolute 1.1 (*)    All other components within normal limits  URINALYSIS, ROUTINE W REFLEX MICROSCOPIC - Abnormal; Notable for the following components:   APPearance CLOUDY (*)    All other components within normal limits   CBG MONITORING, ED - Abnormal; Notable for the following components:   Glucose-Capillary 109 (*)    All other components within normal limits  RESP PANEL BY RT-PCR (RSV, FLU A&B, COVID)  RVPGX2  I-STAT BETA HCG BLOOD, ED ([facility], [facility], [facility] ONLY)    EKG EKG Interpretation  Date/Time:  Sunday September 02 2022 22:23:43 EST Ventricular Rate:  79 PR Interval:  122 QRS Duration: 96 QT Interval:  362 QTC Calculation: 415 R Axis:   40 Text Interpretation: Normal sinus rhythm with sinus arrhythmia When compared with ECG of 07-Jul-2022 18:03, No significant change was found Confirmed by Gilda Crease [phone]) on 09/03/2022 12:39:05 AM  Radiology DG Chest 2 View  Result Date: 09/02/2022 CLINICAL DATA:  Syncope, vomiting EXAM: CHEST - 2 VIEW COMPARISON:  07/07/2022 FINDINGS: The heart size and mediastinal contours are within normal limits. Both lungs are clear. The visualized skeletal structures are unremarkable. IMPRESSION: No active cardiopulmonary disease. Electronically Signed   By: Sharlet Salina M.D.   On: 09/02/2022 23:31    Procedures Procedures    Medications Ordered in ED Medications - No data to display  ED Course/ Medical Decision Making/ A&P                           Medical Decision Making  Patient presents to the emergency department after syncopal episode.  She did not have warning symptoms including ringing or ears followed by feeling hot and flushed.  She passed out without seizure-like activity.  She then had onset of nausea and vomiting.  Suspect a vasovagal episode.  She does, however, reports that she had a similar episode in the past and at time she did have urinary incontinence.  Patient's workup today has been reassuring.  Her vital signs are normal.  She is back to her normal baseline, has a normal neurologic exam.  Will recommend that she has outpatient neurology follow-up.  No driving until neurology follow-up.  Patient also complaining about bumps on her  skin that have been there lifelong and that she thinks she inherited from her family.  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  Recommend that she follow-up at an academic dermatology center, as she has not been able to get dermatology follow-up locally.  No signs of active infection at this time.  Lesions nonpigmented.  Final Clinical Impression(s) / ED Diagnoses Final diagnoses:  Vasovagal syncope    Rx / DC Orders ED Discharge Orders          Ordered    Ambulatory referral to Neurology       Comments: An appointment is requested in approximately: 2 weeks   09/03/22 0058              Gilda Crease, MD 09/03/22 [phone]

## 2023-05-10 ENCOUNTER — Other Ambulatory Visit: Payer: Self-pay

## 2023-05-10 ENCOUNTER — Emergency Department (HOSPITAL_COMMUNITY)
Admission: EM | Admit: 2023-05-10 | Discharge: 2023-05-10 | Disposition: A | Discharge status: Home / Self Care | Payer: Self-pay | Attending: Emergency Medicine | Admitting: Emergency Medicine

## 2023-05-10 DIAGNOSIS — L03213 Periorbital cellulitis: Secondary | ICD-10-CM

## 2023-05-10 DIAGNOSIS — H05012 Cellulitis of left orbit: Secondary | ICD-10-CM | POA: Insufficient documentation

## 2023-05-10 DIAGNOSIS — H0014 Chalazion left upper eyelid: Secondary | ICD-10-CM | POA: Insufficient documentation

## 2023-05-10 MED ORDER — AMOXICILLIN-POT CLAVULANATE 875-125 MG PO TABS
1.0000 | ORAL_TABLET | Freq: Once | ORAL | Status: AC
  Administered 2023-05-10: 1 via ORAL
  Filled 2023-05-10: qty 1

## 2023-05-10 MED ORDER — AMOXICILLIN-POT CLAVULANATE 875-125 MG PO TABS: 1.0000 | ORAL_TABLET | Freq: Two times a day (BID) | ORAL | 0 refills | Status: DC

## 2023-05-10 NOTE — Discharge Instructions (Addendum)
Please follow-up with your primary care doctor, and the ophthalmology as needed.  Make sure to take the antibiotics, as well as use warm compresses to the area.  Return to the ER if the redness, swelling is not improving, or additionally you have any kind of pain to the eye especially with movement of your eyes or loss of vision.  I recommend throwing away your eye make-up, so you do not contaminate your other eye with possible bacteria from this infection.  Do not use any eye make-up while he still has this infection

## 2023-05-10 NOTE — ED Triage Notes (Signed)
Pt presenting today with a swollen left eye since 2 days ago. Pt has tried benadryl, and stye eye drops without relief.

## 2023-05-10 NOTE — ED Provider Notes (Signed)
[name] EMERGENCY DEPARTMENT AT [facility] Provider Note   CSN: [id number] Arrival date & time: 05/10/23  1832     History  Chief Complaint  Patient presents with   Facial Swelling    Julia Benson is a 22 y.o. female, no pertinent past medical history, who presents to the ED secondary to left upper lid swelling, has been going on for the last day and a half.  She states it came on acutely, when she woke up yesterday morning, and notes that has become more red and swollen.  Denies any changes in visions, but does state that her eyelid feels very heavy.  Has not had any fevers or chills.  Does not use any artificial lashes, contacts, or make-up in this region.    Home Medications Prior to Admission medications   Medication Sig Start Date End Date Taking? Authorizing Provider  amoxicillin-clavulanate (AUGMENTIN) 875-125 MG tablet Take 1 tablet by mouth every 12 (twelve) hours. 05/10/23  Yes Kiahna Banghart L, PA  fluticasone (FLONASE) 50 MCG/ACT nasal spray Place 1 spray into both nostrils daily. 02/03/22   Carlisle Beers, FNP  olopatadine (PATANOL) 0.1 % ophthalmic solution Place 1 drop into both eyes 2 (two) times daily. 02/03/22   Carlisle Beers, FNP      Allergies    Patient has no known allergies.    Review of Systems   Review of Systems  Eyes:  Negative for pain and redness.  Skin:  Positive for rash.    Physical Exam Updated Vital Signs BP (!) 140/75 (BP Location: Right Arm)   Pulse 80   Temp 98.1 F (36.7 C) (Oral)   Resp 18   Ht 5\' 2"  (1.575 m)   Wt 83.9 kg   SpO2 100%   BMI 33.84 kg/m  Physical Exam Vitals and nursing note reviewed.  Constitutional:      General: She is not in acute distress.    Appearance: She is well-developed.  HENT:     Head: Normocephalic and atraumatic.  Eyes:     General:        Right eye: No discharge.        Left eye: No discharge.     Conjunctiva/sclera: Conjunctivae normal.     Comments: Swelling  and erythema of left upper lid with increased warmth.  No pain with extraocular movements.  Vision intact  Pulmonary:     Effort: No respiratory distress.  Neurological:     Mental Status: She is alert.     Comments: Clear speech.   Psychiatric:        Behavior: Behavior normal.        Thought Content: Thought content normal.     ED Results / Procedures / Treatments   Labs (all labs ordered are listed, but only abnormal results are displayed) Labs Reviewed  POC URINE PREG, ED    EKG None  Radiology No results found.  Procedures Procedures    Medications Ordered in ED Medications  amoxicillin-clavulanate (AUGMENTIN) 875-125 MG per tablet 1 tablet (has no administration in time range)    ED Course/ Medical Decision Making/ A&P                                 Medical Decision Making Patient is a 22 year old female, here for left upper lip swelling has been going on for the past day and a half.  It came on  abruptly and is just gotten worse.  She has tried Benadryl drops, without any relief.  It is red swollen, and hot.  I am suspicious for possible preseptal cellulitis versus chalazion.  We will put her on Augmentin, and have her follow-up with ophthalmology.  She has not had any vision changes, she does not have any pain with extraocular movements, and has not had any trauma to the area.    Final Clinical Impression(s) / ED Diagnoses Final diagnoses:  Preseptal cellulitis of left upper eyelid  Chalazion left upper eyelid    Rx / DC Orders ED Discharge Orders          Ordered    amoxicillin-clavulanate (AUGMENTIN) 875-125 MG tablet  Every 12 hours        05/10/23 1908              Pete Pelt, PA 05/10/23 1914    Virgina Norfolk, DO 05/10/23 2253

## 2023-10-27 IMAGING — DX DG CHEST 1V PORT
1 series · 1 of 1 positions shown · non-contrast
Comparison: None.

CLINICAL DATA: Cough for 1 week

EXAM:
PORTABLE CHEST 1 VIEW

[chest ap]
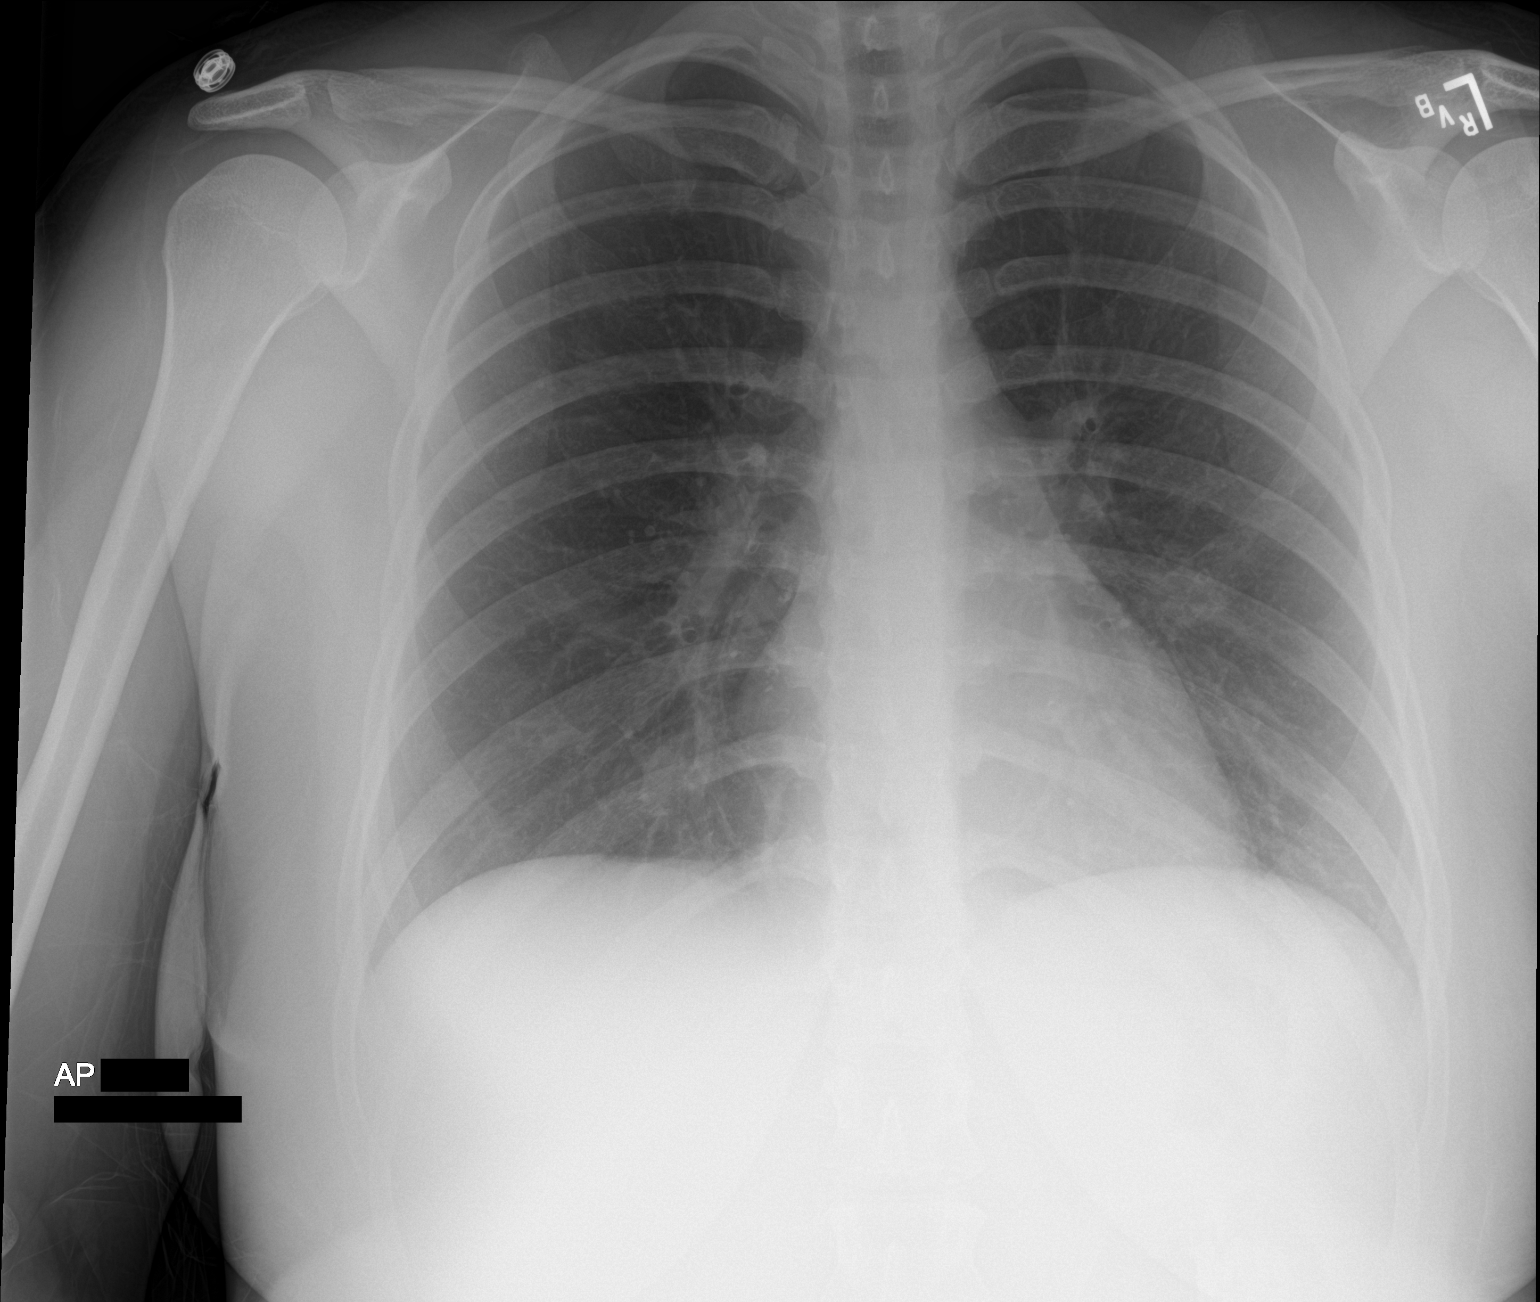

[1 of 1 positions shown; findings below may reference images not displayed]

FINDINGS: The heart size and mediastinal contours are within normal limits. No
focal airspace consolidation, pleural effusion, or pneumothorax. The
visualized skeletal structures are unremarkable.
IMPRESSION: No active disease.

## 2023-12-02 ENCOUNTER — Emergency Department (HOSPITAL_COMMUNITY)
Admission: EM | Admit: 2023-12-02 | Discharge: 2023-12-02 | Disposition: A | Discharge status: Home / Self Care | Payer: Self-pay | Attending: Emergency Medicine | Admitting: Emergency Medicine

## 2023-12-02 ENCOUNTER — Other Ambulatory Visit: Payer: Self-pay

## 2023-12-02 ENCOUNTER — Encounter (HOSPITAL_COMMUNITY): Payer: Self-pay

## 2023-12-02 DIAGNOSIS — B349 Viral infection, unspecified: Secondary | ICD-10-CM

## 2023-12-02 DIAGNOSIS — J101 Influenza due to other identified influenza virus with other respiratory manifestations: Secondary | ICD-10-CM | POA: Insufficient documentation

## 2023-12-02 LAB — RESP PANEL BY RT-PCR (RSV, FLU A&B, COVID)  RVPGX2
Influenza A by PCR: POSITIVE — AB
Influenza B by PCR: NEGATIVE
Resp Syncytial Virus by PCR: NEGATIVE
SARS Coronavirus 2 by RT PCR: NEGATIVE

## 2023-12-02 MED ORDER — BENZONATATE 100 MG PO CAPS: 100.0000 mg | ORAL_CAPSULE | Freq: Three times a day (TID) | ORAL | 0 refills | Status: AC

## 2023-12-02 MED ORDER — ACETAMINOPHEN 500 MG PO TABS: 500.0000 mg | ORAL_TABLET | Freq: Four times a day (QID) | ORAL | 0 refills | Status: AC | PRN

## 2023-12-02 NOTE — ED Triage Notes (Signed)
 Pt reports with cough, chills, body aches, and vomiting x 4 days. Pts husband recently dx with flu.

## 2023-12-02 NOTE — ED Provider Notes (Signed)
 Julia Benson   CSN: [id number] Arrival date & time: 12/02/23  1804     History  Chief Complaint  Patient presents with   Cough   Chills    Julia Benson is a 23 y.o. female.  The history is provided by the patient. No language interpreter was used.  Cough    23 year old female presenting with cold symptoms.  Patient report for the past 4 days she has had bodyaches, chills, subjective fever, nonproductive cough, occasional vomiting, and overall not feeling well.  States her husband also has similar symptoms and was diagnosed with influenza.  She does not endorse any significant shortness of breath and denies any dysuria.  She tries numerous over-the-counter medication without relief.  Home Medications Prior to Admission medications   Medication Sig Start Date End Date Taking? Authorizing Provider  amoxicillin-clavulanate (AUGMENTIN) 875-125 MG tablet Take 1 tablet by mouth every 12 (twelve) hours. 05/10/23   Small, Brooke L, PA  fluticasone (FLONASE) 50 MCG/ACT nasal spray Place 1 spray into both nostrils daily. 02/03/22   Carlisle Beers, FNP  olopatadine (PATANOL) 0.1 % ophthalmic solution Place 1 drop into both eyes 2 (two) times daily. 02/03/22   Carlisle Beers, FNP      Allergies    Patient has no known allergies.    Review of Systems   Review of Systems  Respiratory:  Positive for cough.   All other systems reviewed and are negative.   Physical Exam Updated Vital Signs BP 110/74 (BP Location: Left Arm)   Pulse 66   Temp 98.1 F (36.7 C) (Oral)   Resp 14   Ht 5\' 2"  (1.575 m)   Wt 83.9 kg   SpO2 95%   BMI 33.83 kg/m  Physical Exam Vitals and nursing Benson reviewed.  Constitutional:      General: She is not in acute distress.    Appearance: She is well-developed.  HENT:     Head: Atraumatic.  Eyes:     Conjunctiva/sclera: Conjunctivae normal.  Cardiovascular:     Rate and Rhythm:  Normal rate and regular rhythm.     Pulses: Normal pulses.     Heart sounds: Normal heart sounds.  Pulmonary:     Effort: Pulmonary effort is normal.     Breath sounds: No wheezing, rhonchi or rales.  Abdominal:     Palpations: Abdomen is soft.     Tenderness: There is no abdominal tenderness.  Musculoskeletal:        General: Normal range of motion.     Cervical back: Neck supple.  Skin:    Findings: No rash.  Neurological:     Mental Status: She is alert.  Psychiatric:        Mood and Affect: Mood normal.     ED Results / Procedures / Treatments   Labs (all labs ordered are listed, but only abnormal results are displayed) Labs Reviewed  RESP PANEL BY RT-PCR (RSV, FLU A&B, COVID)  [id number]    EKG None  Radiology No results found.  Procedures Procedures    Medications Ordered in ED Medications - No data to display  ED Course/ Medical Decision Making/ A&P                                 Medical Decision Making  BP 110/74 (BP Location: Left Arm)   Pulse 66   Temp  98.1 F (36.7 C) (Oral)   Resp 14   Ht 5\' 2"  (1.575 m)   Wt 83.9 kg   SpO2 95%   BMI 33.83 kg/m   38:57 PM 23 year old female presenting with cold symptoms.  Patient report for the past 4 days she has had bodyaches, chills, subjective fever, nonproductive cough, occasional vomiting, and overall not feeling well.  States her husband also has similar symptoms and was diagnosed with influenza.  She does not endorse any significant shortness of breath and denies any dysuria.  She tries numerous over-the-counter medication without relief.  On exam, patient is resting comfortably appears to be in no acute discomfort.  Lungs are clear abdomen is soft nontender vital sign reassuring no fever no hypoxia.  Will provide supportive care for suspected influenza.  Work Benson provided per request.  Return precaution given.        Final Clinical Impression(s) / ED Diagnoses Final diagnoses:  Viral illness     Rx / DC Orders ED Discharge Orders          Ordered    benzonatate (TESSALON) 100 MG capsule  Every 8 hours        12/02/23 1908    acetaminophen (TYLENOL) 500 MG tablet  Every 6 hours PRN        12/02/23 1908              Fayrene Helper, PA-C 12/02/23 1909    Bethann Berkshire, MD 12/03/23 1013

## 2023-12-02 NOTE — Discharge Instructions (Addendum)
 You have symptoms likely due to influenza infection.  Please take medication prescribed.  Return if any concern.

## 2023-12-19 ENCOUNTER — Emergency Department (HOSPITAL_COMMUNITY)
Admission: EM | Admit: 2023-12-19 | Discharge: 2023-12-20 | Disposition: A | Discharge status: Home / Self Care | Payer: Worker's Compensation | Attending: Emergency Medicine | Admitting: Emergency Medicine

## 2023-12-19 ENCOUNTER — Other Ambulatory Visit: Payer: Self-pay

## 2023-12-19 DIAGNOSIS — Y990 Civilian activity done for income or pay: Secondary | ICD-10-CM | POA: Insufficient documentation

## 2023-12-19 DIAGNOSIS — S61216A Laceration without foreign body of right little finger without damage to nail, initial encounter: Secondary | ICD-10-CM | POA: Diagnosis present

## 2023-12-19 DIAGNOSIS — W208XXA Other cause of strike by thrown, projected or falling object, initial encounter: Secondary | ICD-10-CM | POA: Insufficient documentation

## 2023-12-19 NOTE — ED Triage Notes (Signed)
 Patient presents with skin laceration at right proximal 5th finger injured at work this evening when a metal part of ice maker hit her right hand , dressing applied at triage .

## 2023-12-20 ENCOUNTER — Emergency Department (HOSPITAL_COMMUNITY): Payer: Self-pay

## 2023-12-20 MED ORDER — LIDOCAINE HCL (PF) 1 % IJ SOLN
5.0000 mL | Freq: Once | INTRAMUSCULAR | Status: AC
  Administered 2023-12-20: 5 mL
  Filled 2023-12-20: qty 5

## 2023-12-20 NOTE — ED Provider Notes (Signed)
 [place] EMERGENCY DEPARTMENT AT [facility] Provider Note   CSN: [id number] Arrival date & time: 12/19/23  2336     History  Chief Complaint  Patient presents with   Hand Injury    Julia Benson is a 23 y.o. female who presents with laceration to the right pinky finger sustained after piece of metal fell while she was at work and landed on her finger.  No numbness tingling or weakness distal to the injury. Up-to-date on tetanus per patient.  HPI     Home Medications Prior to Admission medications   Medication Sig Start Date End Date Taking? Authorizing Provider  acetaminophen (TYLENOL) 500 MG tablet Take 1 tablet (500 mg total) by mouth every 6 (six) hours as needed. 12/02/23   Julia Helper, PA-C  amoxicillin-clavulanate (AUGMENTIN) 875-125 MG tablet Take 1 tablet by mouth every 12 (twelve) hours. 05/10/23   Benson, Julia L, PA  benzonatate (TESSALON) 100 MG capsule Take 1 capsule (100 mg total) by mouth every 8 (eight) hours. 12/02/23   Julia Helper, PA-C  fluticasone (FLONASE) 50 MCG/ACT nasal spray Place 1 spray into both nostrils daily. 02/03/22   Julia Beers, FNP  olopatadine (PATANOL) 0.1 % ophthalmic solution Place 1 drop into both eyes 2 (two) times daily. 02/03/22   Julia Beers, FNP      Allergies    Patient has no known allergies.    Review of Systems   Review of Systems  Skin:  Positive for wound.    Physical Exam Updated Vital Signs BP (!) 131/90   Pulse 84   Temp 98.4 F (36.9 C) (Oral)   Resp 18   SpO2 100%  Physical Exam Vitals and nursing note reviewed.  HENT:     Head: Normocephalic and atraumatic.  Eyes:     General: No scleral icterus.       Right eye: No discharge.        Left eye: No discharge.     Conjunctiva/sclera: Conjunctivae normal.  Pulmonary:     Effort: Pulmonary effort is normal.  Musculoskeletal:       Hands:  Skin:    General: Skin is warm and dry.     Findings: Laceration present.   Neurological:     General: No focal deficit present.     Mental Status: She is alert.  Psychiatric:        Mood and Affect: Mood normal.     ED Results / Procedures / Treatments   Labs (all labs ordered are listed, but only abnormal results are displayed) Labs Reviewed - No data to display  EKG None  Radiology DG Hand Complete Right Result Date: 12/20/2023 CLINICAL DATA:  Injury/laceration  Right 5th digit laceration EXAM: RIGHT HAND - COMPLETE 3+ VIEW COMPARISON:  None Available. FINDINGS: There is no evidence of fracture or dislocation. There is no evidence of arthropathy or other focal bone abnormality. Fifth digit subcutaneus soft tissue edema. No organized fluid collection. IMPRESSION: No acute displaced fracture or dislocation. Electronically Signed   By: Julia Benson M.D.   On: 12/20/2023 01:01    Procedures .Laceration Repair  Date/Time: 12/20/2023 5:22 AM  Performed by: Julia Lore, PA-C Authorized by: Julia Lore, PA-C   Consent:    Consent obtained:  Verbal   Risks discussed:  Infection   Alternatives discussed:  No treatment, delayed treatment and observation Universal protocol:    Patient identity confirmed:  Verbally with patient Anesthesia:    Anesthesia  method:  Nerve block   Block location:  R pinky   Block needle gauge:  25 G   Block anesthetic:  Lidocaine 1% w/o epi   Block technique:  Digital block   Block injection procedure:  Anatomic landmarks identified, introduced needle, incremental injection, anatomic landmarks palpated and negative aspiration for blood   Block outcome:  Anesthesia achieved Laceration details:    Location:  Finger   Finger location:  R Benson finger   Length (cm):  3 Pre-procedure details:    Preparation:  Imaging obtained to evaluate for foreign bodies and patient was prepped and draped in usual sterile fashion Exploration:    Hemostasis achieved with:  Direct pressure   Imaging obtained: x-ray      Imaging outcome: foreign body not noted   Treatment:    Area cleansed with:  Saline   Amount of cleaning:  Standard Skin repair:    Repair method:  Sutures   Suture size:  5-0   Suture material:  Prolene   Suture technique:  Simple interrupted   Number of sutures:  5 Approximation:    Approximation:  Close Repair type:    Repair type:  Simple Post-procedure details:    Dressing:  Non-adherent dressing   Procedure completion:  Tolerated well, no immediate complications     Medications Ordered in ED Medications  lidocaine (PF) (XYLOCAINE) 1 % injection 5 mL (5 mLs Other Given 12/20/23 0410)    ED Course/ Medical Decision Making/ A&P                                 Medical Decision Making 23 year old female with laceration to the right pinky finger.  Vascularly intact in the digit.  Wound thoroughly irrigated by ED RN Julia Benson.  Neurologically intact as well, no concern for tendinous injury given full active range of motion in the digit both in flexion and extension.  Very superficial injury, does not probe to bone.  Hemostatic.  Amount and/or Complexity of Data Reviewed Radiology: ordered.    Details: No foreign body or underlying osseous injury on x-ray.  Risk Prescription drug management.   Laceration repaired as above.  Patient tolerated procedure well.  Strict turn precautions were given.  Julia Benson voiced understanding of her medical evaluation and treatment plan. Each of their questions answered to their expressed satisfaction.  Return precautions were given.  Patient is well-appearing, stable, and was discharged in good condition.  This chart was dictated using voice recognition software, Dragon. Despite the best efforts of this provider to proofread and correct errors, errors may still occur which can change documentation meaning.         Final Clinical Impression(s) / ED Diagnoses Final diagnoses:  Laceration of right little finger without foreign body without  damage to nail, initial encounter    Rx / DC Orders ED Discharge Orders     None         Julia Benson 12/20/23 0549    Benson, April, MD 12/20/23 [phone]

## 2023-12-20 NOTE — Discharge Instructions (Addendum)
 You were seen in the ER today for your finger laceration. This was repaired with 5 sutures.  Please follow for suture removal in 10-14 days. Return to the ER if you develop any redness swelling puslike discharge from area or any other severe symptoms.

## 2023-12-20 NOTE — ED Notes (Addendum)
 Discharge instructions reviewed.   Opportunity for questions and concerns provided.   Alert, oriented and ambulatory.   Displays no signs of distress.   Declined discharge vital signs. She says they've been normal all 6 hours she was here.   Encouraged to follow up 7-10 days for suture removal.

## 2023-12-30 ENCOUNTER — Other Ambulatory Visit: Payer: Self-pay

## 2023-12-30 ENCOUNTER — Emergency Department (HOSPITAL_COMMUNITY)
Admission: EM | Admit: 2023-12-30 | Discharge: 2023-12-30 | Disposition: A | Discharge status: Home / Self Care | Payer: Self-pay | Attending: Emergency Medicine | Admitting: Emergency Medicine

## 2023-12-30 ENCOUNTER — Encounter (HOSPITAL_COMMUNITY): Payer: Self-pay

## 2023-12-30 DIAGNOSIS — S61216D Laceration without foreign body of right little finger without damage to nail, subsequent encounter: Secondary | ICD-10-CM | POA: Insufficient documentation

## 2023-12-30 DIAGNOSIS — X58XXXD Exposure to other specified factors, subsequent encounter: Secondary | ICD-10-CM | POA: Insufficient documentation

## 2023-12-30 DIAGNOSIS — Z4802 Encounter for removal of sutures: Secondary | ICD-10-CM | POA: Insufficient documentation

## 2023-12-30 NOTE — Discharge Instructions (Signed)
 Your sutures have been removed today.  Please keep your laceration site clean with soap and water.  Cover with antibiotic ointment such as Neosporin or bacitracin and cover with a Band-Aid until it is fully healed.  Please follow-up with the hand surgery office to discuss the limited range of motion in your pinky finger.  Return to the ER for any other new or emergent concerns.

## 2023-12-30 NOTE — ED Provider Notes (Signed)
 [place] EMERGENCY DEPARTMENT AT [facility] Provider Note   CSN: [id number] Arrival date & time: 12/30/23  1416     History  No chief complaint on file.   Julia Benson is a 23 y.o. female who presents requesting suture removal.  States she had 5 sutures placed to her right little finger on 12/19/23.  She denies any redness or pus drainage from her laceration site.  Reports difficulty fully moving her pinky finger.  Denies any numbness or tingling in the pinky finger.  HPI     Home Medications Prior to Admission medications   Medication Sig Start Date End Date Taking? Authorizing Provider  acetaminophen  (TYLENOL ) 500 MG tablet Take 1 tablet (500 mg total) by mouth every 6 (six) hours as needed. 12/02/23   Debbra Fairy, PA-C  amoxicillin -clavulanate (AUGMENTIN ) 875-125 MG tablet Take 1 tablet by mouth every 12 (twelve) hours. 05/10/23   Small, Brooke L, PA  benzonatate  (TESSALON ) 100 MG capsule Take 1 capsule (100 mg total) by mouth every 8 (eight) hours. 12/02/23   Debbra Fairy, PA-C  fluticasone  (FLONASE ) 50 MCG/ACT nasal spray Place 1 spray into both nostrils daily. 02/03/22   Starlene Eaton, FNP  olopatadine  (PATANOL) 0.1 % ophthalmic solution Place 1 drop into both eyes 2 (two) times daily. 02/03/22   Starlene Eaton, FNP      Allergies    Patient has no known allergies.    Review of Systems   Review of Systems  Skin:  Positive for wound.    Physical Exam Updated Vital Signs BP 118/72 (BP Location: Left Arm)   Pulse 73   Temp 98.2 F (36.8 C)   Resp 17   SpO2 100%  Physical Exam Vitals and nursing note reviewed.  Constitutional:      Appearance: Normal appearance.  HENT:     Head: Atraumatic.  Cardiovascular:     Comments: Cap refill less than 2 seconds on the right fifth digit Pulmonary:     Effort: Pulmonary effort is normal.  Musculoskeletal:     Comments: Limited flexion and extension at the right fifth MCP, PIP.  Unable to flex and  extend at the right fifth digit DIP.  Skin:    Comments: Laceration site noted at the base of the right pinky finger, with proper healing noted.  Clean dry and intact.  5 sutures in place.  Neurological:     General: No focal deficit present.     Mental Status: She is alert.     Comments: Intact sensation of the right fifth digit  Psychiatric:        Mood and Affect: Mood normal.        Behavior: Behavior normal.     ED Results / Procedures / Treatments   Labs (all labs ordered are listed, but only abnormal results are displayed) Labs Reviewed - No data to display  EKG None  Radiology No results found.  Procedures Suture Removal  Date/Time: 12/30/2023 4:42 PM  Performed by: Rexie Catena, PA-C Authorized by: Rexie Catena, PA-C   Consent:    Consent obtained:  Verbal   Consent given by:  Patient   Risks, benefits, and alternatives were discussed: yes     Risks discussed:  Bleeding   Alternatives discussed:  No treatment Universal protocol:    Patient identity confirmed:  Verbally with patient Location:    Location:  Upper extremity   Upper extremity location:  Hand   Hand location:  L small finger  Procedure details:    Wound appearance:  No signs of infection and clean   Number of sutures removed:  5 Post-procedure details:    Post-removal:  Antibiotic ointment applied and Band-Aid applied   Procedure completion:  Tolerated well, no immediate complications     Medications Ordered in ED Medications - No data to display  ED Course/ Medical Decision Making/ A&P                                 Medical Decision Making    Differential diagnosis includes but is not limited to suture removal, cellulitis, tendon injury  ED Course:  Patient had 5 sutures placed in the right pinky finger on/10/25.  Laceration site clean and dry with proper healing noted.  Sutures were removed without difficulty.  Patient has difficulty with flexion and extension of the  right fifth digit DIP.  This is concerning for tendon injury.  We discussed that she needs to follow-up with hand surgery as soon as possible for further evaluation.  Stable and appropriate for discharge home     Impression: Suture removal Decreased range of motion of the right hand fifth digit DIP  Disposition:  The patient was discharged home with instructions to call the hand surgery office as soon as possible for follow-up appointment given her decreased range of motion in the right pinky finger.  Keep the laceration site clean with soap and water.  Keep covered with antibiotic ointment and covered with a Band-Aid until fully healed. Return precautions given.    Record Review: External records from outside source obtained and reviewed including ER note from 12/19/2023     This chart was dictated using voice recognition software, Dragon. Despite the best efforts of this provider to proofread and correct errors, errors may still occur which can change documentation meaning.          Final Clinical Impression(s) / ED Diagnoses Final diagnoses:  Visit for suture removal    Rx / DC Orders ED Discharge Orders     None         Rexie Catena, PA-C 12/30/23 1645    Auston Blush, MD 12/31/23 1247

## 2023-12-30 NOTE — ED Triage Notes (Addendum)
 Pt states she got 5 sutures placed on right 5th finger on 4/10, was told to come back for removal; endorses pain with movement

## 2024-02-25 ENCOUNTER — Other Ambulatory Visit: Payer: Self-pay

## 2024-02-25 ENCOUNTER — Ambulatory Visit
Admission: EM | Admit: 2024-02-25 | Discharge: 2024-02-25 | Disposition: A | Discharge status: Home / Self Care | Payer: Self-pay | Attending: Physician Assistant | Admitting: Physician Assistant

## 2024-02-25 ENCOUNTER — Encounter: Payer: Self-pay | Admitting: Emergency Medicine

## 2024-02-25 DIAGNOSIS — S40022A Contusion of left upper arm, initial encounter: Secondary | ICD-10-CM | POA: Insufficient documentation

## 2024-02-25 DIAGNOSIS — Z202 Contact with and (suspected) exposure to infections with a predominantly sexual mode of transmission: Secondary | ICD-10-CM | POA: Insufficient documentation

## 2024-02-25 LAB — POCT URINALYSIS DIP (MANUAL ENTRY)
Glucose, UA: NEGATIVE mg/dL
Ketones, POC UA: NEGATIVE mg/dL
Nitrite, UA: NEGATIVE
Protein Ur, POC: 30 mg/dL — AB
Spec Grav, UA: >=1.03 — AB (ref 1.010–1.025)
Urobilinogen, UA: 2 U/dL — AB
pH, UA: 6 (ref 5.0–8.0)

## 2024-02-25 LAB — POC URINE PREG, ED: Preg Test, Ur: NEGATIVE

## 2024-02-25 LAB — POCT URINE PREGNANCY: Preg Test, Ur: NEGATIVE

## 2024-02-25 MED ORDER — AZITHROMYCIN 500 MG PO TABS
1000.0000 mg | ORAL_TABLET | Freq: Once | ORAL | Status: AC
  Administered 2024-02-25: 1000 mg via ORAL

## 2024-02-25 MED ORDER — CEFTRIAXONE SODIUM 500 MG IJ SOLR: 500.0000 mg | Freq: Once | INTRAMUSCULAR | Status: DC

## 2024-02-25 MED ORDER — DOXYCYCLINE HYCLATE 100 MG PO CAPS: 100.0000 mg | ORAL_CAPSULE | Freq: Two times a day (BID) | ORAL | 0 refills | Status: DC

## 2024-02-25 MED ORDER — LIDOCAINE HCL (PF) 1 % IJ SOLN: 1.0000 mL | Freq: Once | INTRAMUSCULAR | Status: DC
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
## 2024-02-25 NOTE — Discharge Instructions (Signed)
 Return if any problems.

## 2024-02-25 NOTE — ED Notes (Signed)
 [employer] contacted per pt request

## 2024-02-26 ENCOUNTER — Ambulatory Visit (HOSPITAL_COMMUNITY): Payer: Self-pay

## 2024-02-26 LAB — CERVICOVAGINAL ANCILLARY ONLY
Bacterial Vaginitis (gardnerella): NEGATIVE
Candida Glabrata: NEGATIVE
Candida Vaginitis: NEGATIVE
Chlamydia: NEGATIVE
Comment: NEGATIVE
Comment: NEGATIVE
Comment: NEGATIVE
Comment: NEGATIVE
Comment: NEGATIVE
Comment: NORMAL
Neisseria Gonorrhea: NEGATIVE
Trichomonas: POSITIVE — AB

## 2024-02-26 LAB — RPR: RPR Ser Ql: NONREACTIVE

## 2024-02-26 LAB — HIV ANTIBODY (ROUTINE TESTING W REFLEX): HIV Screen 4th Generation wRfx: NONREACTIVE

## 2024-02-26 MED ORDER — METRONIDAZOLE 500 MG PO TABS: 500.0000 mg | ORAL_TABLET | Freq: Two times a day (BID) | ORAL | 0 refills | Status: AC

## 2024-03-14 ENCOUNTER — Emergency Department (HOSPITAL_COMMUNITY)
Admission: EM | Admit: 2024-03-14 | Discharge: 2024-03-14 | Disposition: A | Discharge status: Home / Self Care | Payer: Self-pay | Attending: Emergency Medicine | Admitting: Emergency Medicine

## 2024-03-14 ENCOUNTER — Encounter (HOSPITAL_COMMUNITY): Payer: Self-pay

## 2024-03-14 ENCOUNTER — Other Ambulatory Visit: Payer: Self-pay

## 2024-03-14 DIAGNOSIS — H6503 Acute serous otitis media, bilateral: Secondary | ICD-10-CM | POA: Insufficient documentation

## 2024-03-14 MED ORDER — AMOXICILLIN-POT CLAVULANATE 875-125 MG PO TABS
1.0000 | ORAL_TABLET | Freq: Once | ORAL | Status: AC
Start: 2024-03-14 — End: 2024-03-14
  Administered 2024-03-14: 1 via ORAL
  Filled 2024-03-14: qty 1

## 2024-03-14 MED ORDER — AMOXICILLIN 500 MG PO CAPS: 500.0000 mg | ORAL_CAPSULE | Freq: Three times a day (TID) | ORAL | 0 refills | Status: AC

## 2024-03-14 NOTE — ED Triage Notes (Signed)
 L sided ear fullness and pain x several days.

## 2024-03-14 NOTE — ED Provider Notes (Signed)
 [facility] Emergency Department Provider Note MRN:  [id number]  Arrival date & time: 03/14/24     Chief Complaint   Ear Fullness   History of Present Illness   Julia Benson is a 23 y.o. year-old female presents to the ED with chief complaint of bilateral ear fullness.  She states she has had symptoms for the past week.  She has been using peroxide.  She denies fevers or chills.  She reports some mild associated sore throat.  She states that she has not pregnant or breast-feeding.  Denies any other associated symptoms.  History provided by patient.   Review of Systems  Pertinent positive and negative review of systems noted in HPI.    Physical Exam   Vitals:   03/14/24 2017  BP: 138/88  Pulse: (!) 110  Resp: 18  Temp: 98.8 F (37.1 C)  SpO2: 100%    CONSTITUTIONAL:  non toxic-appearing, NAD NEURO:  Alert and oriented x 3, CN 3-12 grossly intact EYES:  eyes equal and reactive ENT/NECK:  Supple, no stridor, mild serous effusion bilaterally CARDIO:  mildly tachycardic, regular rhythm, appears well-perfused  PULM:  No respiratory distress, CTAB GI/GU:  non-distended,  MSK/SPINE:  No gross deformities, no edema, moves all extremities  SKIN:  no rash, atraumatic   *Additional and/or pertinent findings included in MDM below  Diagnostic and Interventional Summary    EKG Interpretation Date/Time:    Ventricular Rate:    PR Interval:    QRS Duration:    QT Interval:    QTC Calculation:   R Axis:      Text Interpretation:         Labs Reviewed - No data to display  No orders to display    Medications  amoxicillin -clavulanate (AUGMENTIN ) 875-125 MG per tablet 1 tablet (has no administration in time range)     Procedures  /  Critical Care Procedures  ED Course and Medical Decision Making  I have reviewed the triage vital signs, the nursing notes, and pertinent available records from the EMR.  Social Determinants Affecting  Complexity of Care: Patient has no clinically significant social determinants affecting this chief complaint..   ED Course:    Medical Decision Making Patient with ear fullness bilaterally.  She has some serous effusion bilaterally as well.  No erythema.  Given patient's symptoms, will treat.  Recommend PCP follow-up.  Risk Prescription drug management.         Consultants: No consultations were needed in caring for this patient.   Treatment and Plan: Emergency department workup does not suggest an emergent condition requiring admission or immediate intervention beyond  what has been performed at this time. The patient is safe for discharge and has  been instructed to return immediately for worsening symptoms, change in  symptoms or any other concerns    Final Clinical Impressions(s) / ED Diagnoses     ICD-10-CM   1. Bilateral acute serous otitis media, recurrence not specified  H65.03       ED Discharge Orders          Ordered    amoxicillin  (AMOXIL ) 500 MG capsule  3 times daily        03/14/24 2230              Discharge Instructions Discussed with and Provided to Patient:   Discharge Instructions   None      Vicky Charleston, PA-C 03/14/24 2230    Mannie Pac T, DO 03/15/24 1307

## 2024-03-19 MED ORDER — AMOXICILLIN-POT CLAVULANATE 875-125 MG PO TABS: 1.0000 | ORAL_TABLET | Freq: Two times a day (BID) | ORAL | 0 refills | Status: AC

## 2024-04-17 ENCOUNTER — Ambulatory Visit
Admission: EM | Admit: 2024-04-17 | Discharge: 2024-04-17 | Disposition: A | Discharge status: Home / Self Care | Payer: Self-pay | Attending: Family Medicine | Admitting: Family Medicine

## 2024-04-17 DIAGNOSIS — R051 Acute cough: Secondary | ICD-10-CM

## 2024-04-17 DIAGNOSIS — H6991 Unspecified Eustachian tube disorder, right ear: Secondary | ICD-10-CM

## 2024-04-17 DIAGNOSIS — J069 Acute upper respiratory infection, unspecified: Secondary | ICD-10-CM

## 2024-04-17 DIAGNOSIS — H60391 Other infective otitis externa, right ear: Secondary | ICD-10-CM

## 2024-04-17 LAB — POC SOFIA SARS ANTIGEN FIA: SARS Coronavirus 2 Ag: NEGATIVE

## 2024-04-17 MED ORDER — NEOMYCIN-POLYMYXIN-HC 3.5-10000-1 OT SUSP: 4.0000 [drp] | Freq: Three times a day (TID) | OTIC | 0 refills | Status: AC

## 2024-04-17 MED ORDER — PREDNISONE 20 MG PO TABS: 40.0000 mg | ORAL_TABLET | Freq: Every day | ORAL | 0 refills | Status: AC

## 2024-04-17 MED ORDER — FLUTICASONE PROPIONATE 50 MCG/ACT NA SUSP
1.0000 | Freq: Every day | NASAL | 0 refills | Status: AC
Start: 2024-04-17 — End: ?
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
## 2024-04-17 NOTE — Discharge Instructions (Signed)
 You tested negative for COVID.  Start Flonase  daily.  Cortisporin antibiotic eardrops to the right ear for 7 days.  Keep water out of the ear until you are done with treatment.  Start prednisone  daily for 5 days.  Lots of rest and fluids.  Please follow-up with your PCP in 2 to 3 days for recheck.  Please go to the ER if you develop any worsening symptoms.  I hope you feel better soon exclamation
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
## 2024-07-28 ENCOUNTER — Ambulatory Visit
Admission: EM | Admit: 2024-07-28 | Discharge: 2024-07-28 | Disposition: A | Discharge status: Home / Self Care | Payer: Self-pay | Attending: Urgent Care | Admitting: Urgent Care

## 2024-07-28 DIAGNOSIS — J209 Acute bronchitis, unspecified: Secondary | ICD-10-CM

## 2024-07-28 MED ORDER — PROMETHAZINE-DM 6.25-15 MG/5ML PO SYRP: 5.0000 mL | ORAL_SOLUTION | Freq: Three times a day (TID) | ORAL | 0 refills | Status: AC | PRN

## 2024-07-28 MED ORDER — ALBUTEROL SULFATE HFA 108 (90 BASE) MCG/ACT IN AERS: 1.0000 | INHALATION_SPRAY | Freq: Four times a day (QID) | RESPIRATORY_TRACT | 0 refills | Status: AC | PRN

## 2024-07-28 MED ORDER — CETIRIZINE HCL 10 MG PO TABS: 10.0000 mg | ORAL_TABLET | Freq: Every day | ORAL | 0 refills | Status: AC

## 2024-07-28 MED ORDER — PREDNISONE 20 MG PO TABS: ORAL_TABLET | ORAL | 0 refills | Status: AC

## 2024-07-28 NOTE — ED Provider Notes (Signed)
 [facility]  Note:  This document was prepared using Conservation officer, historic buildings and may include unintentional dictation errors.  MRN: [id number] DOB: 05-Aug-2001  Subjective:   Julia Benson is a 23 y.o. female presenting for 4 day history of acute onset sinus congestion, productive cough, chest pain, shortness of breath and wheezing.  Patient reports that she was diagnosed with asthma in the past few years after having COVID.  Patient smokes marijuana daily, vapes daily as well.    No current facility-administered medications for this encounter.  Current Outpatient Medications:    acetaminophen  (TYLENOL ) 500 MG tablet, Take 1 tablet (500 mg total) by mouth every 6 (six) hours as needed., Disp: 30 tablet, Rfl: 0   amoxicillin  (AMOXIL ) 500 MG capsule, Take 1 capsule (500 mg total) by mouth 3 (three) times daily., Disp: 30 capsule, Rfl: 0   amoxicillin -clavulanate (AUGMENTIN ) 875-125 MG tablet, Take 1 tablet by mouth every 12 (twelve) hours., Disp: 14 tablet, Rfl: 0   benzonatate  (TESSALON ) 100 MG capsule, Take 1 capsule (100 mg total) by mouth every 8 (eight) hours., Disp: 21 capsule, Rfl: 0   fluticasone  (FLONASE ) 50 MCG/ACT nasal spray, Place 1 spray into both nostrils daily., Disp: 15.8 mL, Rfl: 0   olopatadine  (PATANOL) 0.1 % ophthalmic solution, Place 1 drop into both eyes 2 (two) times daily., Disp: 5 mL, Rfl: 12   No Known Allergies  History reviewed. No pertinent past medical history.   History reviewed. No pertinent surgical history.  History reviewed. No pertinent family history.  Social History   Tobacco Use   Smoking status: Former    Types: Cigarettes   Smokeless tobacco: Never  Vaping Use   Vaping status: Every Day  Substance Use Topics   Alcohol use: Yes   Drug use: Yes    Frequency: 7.0 times per week    Types: Marijuana    Comment: everyday    ROS   Objective:   Vitals: BP 116/72 (BP Location: Left Arm)   Pulse (!)  103   Temp (!) 97.4 F (36.3 C)   Resp 16   LMP  (Within Months) Comment: 1 MONTH  SpO2 96%   Physical Exam Constitutional:      General: She is not in acute distress.    Appearance: Normal appearance. She is well-developed and normal weight. She is not ill-appearing, toxic-appearing or diaphoretic.  HENT:     Head: Normocephalic and atraumatic.     Right Ear: Tympanic membrane, ear canal and external ear normal. No drainage or tenderness. No middle ear effusion. There is no impacted cerumen. Tympanic membrane is not erythematous or bulging.     Left Ear: Tympanic membrane, ear canal and external ear normal. No drainage or tenderness.  No middle ear effusion. There is no impacted cerumen. Tympanic membrane is not erythematous or bulging.     Nose: Nose normal. No congestion or rhinorrhea.     Mouth/Throat:     Mouth: Mucous membranes are moist. No oral lesions.     Pharynx: No pharyngeal swelling, oropharyngeal exudate, posterior oropharyngeal erythema or uvula swelling.     Tonsils: No tonsillar exudate or tonsillar abscesses.  Eyes:     General: No scleral icterus.       Right eye: No discharge.        Left eye: No discharge.     Extraocular Movements: Extraocular movements intact.     Right eye: Normal extraocular motion.     Left eye:  Normal extraocular motion.     Conjunctiva/sclera: Conjunctivae normal.  Cardiovascular:     Rate and Rhythm: Normal rate and regular rhythm.     Heart sounds: Normal heart sounds. No murmur heard.    No friction rub. No gallop.  Pulmonary:     Effort: Pulmonary effort is normal. No respiratory distress.     Breath sounds: No stridor. Rhonchi (trace) present. No wheezing or rales.  Chest:     Chest wall: No tenderness.  Musculoskeletal:     Cervical back: Normal range of motion and neck supple.  Lymphadenopathy:     Cervical: No cervical adenopathy.  Skin:    General: Skin is warm and dry.  Neurological:     General: No focal deficit  present.     Mental Status: She is alert and oriented to person, place, and time.  Psychiatric:        Mood and Affect: Mood normal.        Behavior: Behavior normal.     Assessment and Plan :   PDMP not reviewed this encounter.  1. Acute bronchitis, unspecified organism    Will defer imaging for now.  Recommended a regimen of prednisone , albuterol  and supportive care for acute bronchitis.  Counseled patient on potential for adverse effects with medications prescribed/recommended today, ER and return-to-clinic precautions discussed, patient verbalized understanding.    Christopher Savannah, PA-C 07/28/24 1447

## 2024-07-28 NOTE — ED Triage Notes (Signed)
 Pt reports cough and congestion x 3-4 days., OTC meds gives no relief.

## 2024-07-28 NOTE — Discharge Instructions (Addendum)
 Start prednisone  to help with your viral bronchitis infection. Use the inhaler as needed for your wheezing, shortness of breath. Take Zyrtec  daily through the end of this month. Use cough syrup as needed.
